# Patient Record
Sex: Female | Born: 1970 | Race: White | Hispanic: No | Smoking: Current every day smoker
Health system: Southern US, Community
[De-identification: ages and names within clinical notes are randomized; demographics above are authoritative.]

## PROBLEM LIST (undated history)

## (undated) DIAGNOSIS — G8929 Other chronic pain: Secondary | ICD-10-CM

## (undated) DIAGNOSIS — K859 Acute pancreatitis without necrosis or infection, unspecified: Secondary | ICD-10-CM

## (undated) DIAGNOSIS — F419 Anxiety disorder, unspecified: Secondary | ICD-10-CM

## (undated) DIAGNOSIS — M797 Fibromyalgia: Secondary | ICD-10-CM

## (undated) DIAGNOSIS — F32A Depression, unspecified: Secondary | ICD-10-CM

## (undated) DIAGNOSIS — F329 Major depressive disorder, single episode, unspecified: Secondary | ICD-10-CM

## (undated) HISTORY — DX: Anxiety disorder, unspecified: F41.9

## (undated) HISTORY — PX: CERVICAL CONE BIOPSY: SUR198

## (undated) HISTORY — DX: Other chronic pain: G89.29

## (undated) HISTORY — DX: Major depressive disorder, single episode, unspecified: F32.9

## (undated) HISTORY — DX: Depression, unspecified: F32.A

---

## 2001-10-06 ENCOUNTER — Observation Stay (HOSPITAL_COMMUNITY): Admission: RE | Admit: 2001-10-06 | Discharge: 2001-10-07 | Payer: Self-pay | Admitting: Obstetrics and Gynecology

## 2001-12-14 ENCOUNTER — Inpatient Hospital Stay (HOSPITAL_COMMUNITY): Admission: AD | Admit: 2001-12-14 | Discharge: 2001-12-15 | Payer: Self-pay | Admitting: Obstetrics and Gynecology

## 2001-12-22 ENCOUNTER — Ambulatory Visit (HOSPITAL_COMMUNITY): Admission: AD | Admit: 2001-12-22 | Discharge: 2001-12-23 | Payer: Self-pay | Admitting: Obstetrics and Gynecology

## 2002-01-15 ENCOUNTER — Inpatient Hospital Stay (HOSPITAL_COMMUNITY): Admission: AD | Admit: 2002-01-15 | Discharge: 2002-01-17 | Payer: Self-pay | Admitting: Obstetrics and Gynecology

## 2003-03-10 ENCOUNTER — Emergency Department (HOSPITAL_COMMUNITY): Admission: EM | Admit: 2003-03-10 | Discharge: 2003-03-10 | Payer: Self-pay | Admitting: Emergency Medicine

## 2005-09-20 ENCOUNTER — Ambulatory Visit (HOSPITAL_COMMUNITY): Admission: RE | Admit: 2005-09-20 | Discharge: 2005-09-20 | Payer: Self-pay | Admitting: Family Medicine

## 2006-03-14 ENCOUNTER — Ambulatory Visit (HOSPITAL_COMMUNITY): Admission: RE | Admit: 2006-03-14 | Discharge: 2006-03-14 | Payer: Self-pay | Admitting: Family Medicine

## 2007-04-06 ENCOUNTER — Encounter: Admission: RE | Admit: 2007-04-06 | Discharge: 2007-04-06 | Payer: Self-pay | Admitting: Obstetrics and Gynecology

## 2007-04-26 ENCOUNTER — Emergency Department (HOSPITAL_COMMUNITY): Admission: EM | Admit: 2007-04-26 | Discharge: 2007-04-26 | Payer: Self-pay | Admitting: Emergency Medicine

## 2008-05-06 ENCOUNTER — Other Ambulatory Visit: Admission: RE | Admit: 2008-05-06 | Discharge: 2008-05-06 | Payer: Self-pay | Admitting: Obstetrics and Gynecology

## 2008-08-15 ENCOUNTER — Ambulatory Visit (HOSPITAL_COMMUNITY): Admission: RE | Admit: 2008-08-15 | Discharge: 2008-08-15 | Payer: Self-pay | Admitting: Family Medicine

## 2009-05-13 ENCOUNTER — Emergency Department (HOSPITAL_COMMUNITY): Admission: EM | Admit: 2009-05-13 | Discharge: 2009-05-14 | Payer: Self-pay | Admitting: Emergency Medicine

## 2009-06-10 ENCOUNTER — Other Ambulatory Visit: Admission: RE | Admit: 2009-06-10 | Discharge: 2009-06-10 | Payer: Self-pay | Admitting: Obstetrics and Gynecology

## 2010-01-21 ENCOUNTER — Ambulatory Visit (HOSPITAL_COMMUNITY): Admission: RE | Admit: 2010-01-21 | Discharge: 2010-01-21 | Payer: Self-pay | Admitting: Family Medicine

## 2011-03-04 NOTE — H&P (Signed)
[facility]  Patient:    Tammy Howell, Tammy Howell Visit Number: [id number] MRN: [id number]          Service Type: OBS Location: 4A A415 01 Attending Physician:  Tilda Burrow Dictated by:   Zerita Boers, C.N.M. 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  PAST MEDICAL HISTORY:  Positive for abnormal Pap smears.  PAST SURGICAL HISTORY:  1. Positive for cold knife cone in 1998.  2. Cryosurgery.  ALLERGIES:  She has seasonal allergies.  She is not allergic to any medications.  MEDICATIONS:  Prenatal vitamins.  SOCIAL HISTORY:  She is single.  Her significant other is supportive of the pregnancy.  PRENATAL COURSE:  Complicated by some premature contractions, without any cervical dilatation.  She has cervical scarring from a cold knife cone done in 1998.  Blood type is O-positive.  Rubella immune.  Hepatitis B surface antigen negative.  HIV negative.  Serology nonreactive.  Pap smear normal, done on November 20, 2001.  GC and Chlamydia are both negative.  MSAFP within normal limits.  Hemoglobin at 28 weeks 11.3, hematocrit at 28 weeks 34.7.  One hour glucose 123.  GBS is pending.  PHYSICAL EXAMINATION:  VITAL SIGNS:  Weight 176 pounds.  Blood pressure 110/70.  ABDOMEN:  There is good fetal movement.  Fundal height 37 cm.  Vertex presentation noted.  PELVIC:  Tight, fingertip, completely effaced, -1 to 0 station.  Bulging lower uterine segment.  Cervical stenosis noted, and that was discussed with Dr. Emelda Fear for our plan of care.  PLAN:  We are going to admit on January 15, 2002 for Foley bulb induction of labor due to maternal fatigue. Dictated by:   Zerita Boers,  C.N.M. Attending Physician:  Tilda Burrow DD:  01/14/02 TD:  01/14/02 Job: [id number]

## 2011-03-04 NOTE — Op Note (Signed)
[facility]  Patient:    Tammy Howell, DONER Visit Number: [id number] MRN: [id number]          Service Type: OBS Location: 4A A415 01 Attending Physician:  Tilda Burrow Dictated by:   Zerita Boers, CNM Admit Date:  12/22/2001 Discharge Date: 12/23/2001   CC:         [facility]   Operative Report                           DELIVERY NOTE  ONSET OF LABOR:  January 16, 2002 at 8 a.m.  DATE OF DELIVERY:  January 16, 2002 at 8:59 a.m.  LENGTH OF FIRST STAGE OF LABOR:  45 minutes.  LENGTH OF SECOND STAGE OF LABOR:  14 minutes.  LENGTH OF THIRD STATE OF LABOR:  6 minutes.  DELIVERY NOTE:  Approximately 8:15 Dr. Emelda Fear was in to examine the patient. She was 1 cm.  She had a stenotic cervix due to a cold knife conization. After exam, cervix was broken up by Dr. Emelda Fear.  She was 3 cm.  She rapidly progressed to complete at 8:45 and had spontaneous delivery of a viable female infant over an intact perineum with Apgars of 9 and 9.  Upon delivery of head, nuchal cord was noted loosely with the fist up through the cord.  That was easily reduced and the infant slipped through without any difficulty.  Upon delivery, the infant had vigorous tone, lusty cry.  Infant was dried.  The cord was clamped and cut and the infant placed on mothers abdomen and attended to by nursing staff.  Placenta was delivered via [facility] mechanism. Three-vessel cord was noted.  Membranes were intact.  Third stage of labor was managed with 1000 cc of lactated Ringers with 20 units of Pitocin in a rapid rate.  Estimated blood loss was approximately 200 cc.  Infant and mother are both stabilized and transferred out to the postpartum unit in stable condition. Dictated by:   Zerita Boers, CNM Attending Physician:  Tilda Burrow DD:  01/16/02 TD:  01/17/02 Job: [id number]

## 2011-03-04 NOTE — H&P (Signed)
[facility]  Patient:    Tammy Howell, Tammy Howell Visit Number: [id number] MRN: [id number]          Service Type: OBS Location: 4A A427 01 Attending Physician:  Tilda Burrow Dictated by:   Christin Bach, M.D. Admit Date:  12/14/2001                           History and Physical  ADMITTING DIAGNOSIS:  Pregnancy at 34 weeks and 6 days, preterm labor.  HISTORY OF PRESENT ILLNESS:  This is a 40 year old female, gravida 3, para 1-0-1-1.  Last menstrual period April 14, 2001, placing menstrual [facility] January 19, 2002, with an ultrasound [facility] of January 26, 2002 and January 27, 2002, who is admitted at 34 weeks and 6 days by menstrual criteria and 33 weeks and 6 days by ultrasound criteria after a pregnancy followed through our office.  She presents today after having mild symptoms of "feeling different all week" and then presenting with contractions since the early morning hours which she was initially able to sleep through, but when she comes to labor and delivery, the cervix has indeed changed somewhat, and today, is closed, internal os very thin with a dilated lower uterine segment, and 90-100% effacement.  The station is -2.  The cervix is mid position.  Membranes are intact. Contraction pattern shows a mild contraction pattern every two to three minutes.  Tolerated very well by the patient.  She has a history of preterm labor x4 with her last pregnancy, which ultimately delivered at 38 weeks as according to her prenatal record, but 36 weeks according to discussions today. The infant weighed 6 pounds and 6 ounces and did well.  The patient is very familiar with tocolytic agents.  The patient has been here since 10 this a.m. and at 2:00 has not changed her cervix, but continues to have regular uterine contractions despite hydration. After discussion of treatment options, our plans are to perform tocolysis with ________ with magnesium sulfate.  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  OBSTETRIC HISTORY:  Preterm labor x4 with the last pregnancy.  ALLERGIES:  SEASONAL allergies only.  SOCIAL HISTORY:  Engaged and works at [employer].  PHYSICAL EXAMINATION:  VITAL SIGNS:  Height 5 feet and 4 inches, weight 175, blood pressure 138/78.  GENERAL:  Exam shows a healthy, alert, and active Caucasian female, tolerating contractions very well.  HEENT:  Pupils equal, round, and reactive.  ABDOMEN:  Thirty-four centimeters.  Estimated fetal weight 5 pounds.  CERVIX:  Closed, 90-100% effaced, vertex presentation with well-developed lower uterine segment.  PLAN:  Magnesium sulfate.  Anticipate overnight hospital stay.  May require allowing her to labor. Dictated by:   Christin Bach, M.D. Attending Physician:  Tilda Burrow DD:  12/14/01 TD:  12/14/01 Job: [id number]

## 2011-03-04 NOTE — Discharge Summary (Signed)
[facility]  Patient:    Tammy Howell, Tammy Howell Visit Number: [id number] MRN: [id number]          Service Type: OBS Location: 4A A427 01 Attending Physician:  Tilda Burrow Dictated by:   Christin Bach, M.D. Admit Date:  12/14/2001 Discharge Date: 12/15/2001                             Discharge Summary  DIAGNOSES: 1. Pregnancy, 34 weeks 6 days. 2. Preterm labor.  DISCHARGE DIAGNOSES: 1. Pregnancy, 35 weeks. 2. Preterm labor, resolved.  PROCEDURE:  Magnesium sulfate, tocolysis x12 hours.  DISCHARGE MEDICATIONS: 1. Ambien 10 mg p.o. q.h.s. 2. Brethine 5 mg 2.5-5 mg p.o. q.6h. x1 week.  DISPOSITION:  Followup in our office in 3-5 days for recheck.  HOSPITAL SUMMARY:  This 40 year old female, gravida 3, para 1-0-1-1, who had a prior delivery at 36 weeks after recurrent episodes of preterm labor x4 is admitted after presenting with regular uterine contractions since early morning hours of December 14, 2001. She was observed, hydrated, and the contractions did not stop, so she was placed on magnesium sulfate tocolysis at 2 g per hour after 4 g loading dose. Evaluation did not identify a UTI or other etiology to the irritability, and it was considered idiopathic. She responded to the magnesium sulfate therapy. At 2 a.m. the IV infiltrated and she was able to be switched to oral terbutaline tablets. She was stable for discharge at midmorning December 15, 2001, for followup in one week. She is to continue the Brethine until 36 weeks, seven days from now. Dictated by:   Christin Bach, M.D. Attending Physician:  Tilda Burrow DD:  12/15/01 TD:  12/15/01 Job: [id number]

## 2011-08-02 LAB — CBC
HCT: 38.8
MCV: 89
RBC: 4.36
WBC: 8.6

## 2011-08-02 LAB — URINALYSIS, ROUTINE W REFLEX MICROSCOPIC
Glucose, UA: NEGATIVE
Ketones, ur: NEGATIVE
Leukocytes, UA: NEGATIVE
Protein, ur: NEGATIVE

## 2011-08-02 LAB — DIFFERENTIAL
Eosinophils Absolute: 0.2
Eosinophils Relative: 2
Lymphs Abs: 2.4
Monocytes Relative: 5

## 2011-08-02 LAB — BASIC METABOLIC PANEL
BUN: 12
Chloride: 108
Potassium: 3.9

## 2011-08-02 LAB — URINE MICROSCOPIC-ADD ON

## 2013-02-19 ENCOUNTER — Other Ambulatory Visit (HOSPITAL_COMMUNITY): Payer: Self-pay | Admitting: Family Medicine

## 2013-02-19 DIAGNOSIS — Z139 Encounter for screening, unspecified: Secondary | ICD-10-CM

## 2013-02-25 ENCOUNTER — Ambulatory Visit (HOSPITAL_COMMUNITY): Payer: Self-pay

## 2013-06-19 ENCOUNTER — Other Ambulatory Visit (HOSPITAL_COMMUNITY): Payer: Self-pay | Admitting: Family Medicine

## 2013-06-19 ENCOUNTER — Ambulatory Visit (HOSPITAL_COMMUNITY)
Admission: RE | Admit: 2013-06-19 | Discharge: 2013-06-19 | Disposition: A | Discharge status: Home / Self Care | Payer: BC Managed Care – PPO | Source: Ambulatory Visit | Attending: Family Medicine | Admitting: Family Medicine

## 2013-06-19 DIAGNOSIS — M542 Cervicalgia: Secondary | ICD-10-CM | POA: Insufficient documentation

## 2013-06-19 DIAGNOSIS — M5030 Other cervical disc degeneration, unspecified cervical region: Secondary | ICD-10-CM | POA: Insufficient documentation

## 2013-06-19 DIAGNOSIS — W19XXXA Unspecified fall, initial encounter: Secondary | ICD-10-CM

## 2014-03-04 ENCOUNTER — Other Ambulatory Visit (HOSPITAL_COMMUNITY): Payer: Self-pay | Admitting: Family Medicine

## 2014-03-04 DIAGNOSIS — Z139 Encounter for screening, unspecified: Secondary | ICD-10-CM

## 2014-03-17 ENCOUNTER — Ambulatory Visit (HOSPITAL_COMMUNITY)
Admission: RE | Admit: 2014-03-17 | Discharge: 2014-03-17 | Disposition: A | Discharge status: Home / Self Care | Payer: BC Managed Care – PPO | Source: Ambulatory Visit | Attending: Family Medicine | Admitting: Family Medicine

## 2014-03-17 DIAGNOSIS — Z139 Encounter for screening, unspecified: Secondary | ICD-10-CM

## 2014-03-17 DIAGNOSIS — Z1231 Encounter for screening mammogram for malignant neoplasm of breast: Secondary | ICD-10-CM | POA: Insufficient documentation

## 2014-07-14 ENCOUNTER — Encounter (HOSPITAL_COMMUNITY): Payer: Self-pay | Admitting: Emergency Medicine

## 2014-07-14 ENCOUNTER — Emergency Department (HOSPITAL_COMMUNITY)
Admission: EM | Admit: 2014-07-14 | Discharge: 2014-07-14 | Disposition: A | Discharge status: Home / Self Care | Payer: BC Managed Care – PPO | Attending: Emergency Medicine | Admitting: Emergency Medicine

## 2014-07-14 DIAGNOSIS — Z3202 Encounter for pregnancy test, result negative: Secondary | ICD-10-CM | POA: Insufficient documentation

## 2014-07-14 DIAGNOSIS — IMO0001 Reserved for inherently not codable concepts without codable children: Secondary | ICD-10-CM | POA: Insufficient documentation

## 2014-07-14 DIAGNOSIS — B9689 Other specified bacterial agents as the cause of diseases classified elsewhere: Secondary | ICD-10-CM | POA: Insufficient documentation

## 2014-07-14 DIAGNOSIS — N760 Acute vaginitis: Secondary | ICD-10-CM | POA: Insufficient documentation

## 2014-07-14 DIAGNOSIS — F17200 Nicotine dependence, unspecified, uncomplicated: Secondary | ICD-10-CM | POA: Diagnosis not present

## 2014-07-14 DIAGNOSIS — F10229 Alcohol dependence with intoxication, unspecified: Secondary | ICD-10-CM | POA: Diagnosis not present

## 2014-07-14 DIAGNOSIS — Z79899 Other long term (current) drug therapy: Secondary | ICD-10-CM | POA: Diagnosis not present

## 2014-07-14 DIAGNOSIS — A499 Bacterial infection, unspecified: Secondary | ICD-10-CM | POA: Insufficient documentation

## 2014-07-14 DIAGNOSIS — K852 Alcohol induced acute pancreatitis without necrosis or infection: Secondary | ICD-10-CM

## 2014-07-14 DIAGNOSIS — R109 Unspecified abdominal pain: Secondary | ICD-10-CM | POA: Diagnosis present

## 2014-07-14 DIAGNOSIS — K859 Acute pancreatitis without necrosis or infection, unspecified: Secondary | ICD-10-CM | POA: Diagnosis not present

## 2014-07-14 HISTORY — DX: Fibromyalgia: M79.7

## 2014-07-14 LAB — CBC WITH DIFFERENTIAL/PLATELET
BASOS ABS: 0 10*3/uL (ref 0.0–0.1)
BASOS PCT: 0 % (ref 0–1)
Eosinophils Absolute: 0.2 10*3/uL (ref 0.0–0.7)
Eosinophils Relative: 2 % (ref 0–5)
HEMATOCRIT: 39.8 % (ref 36.0–46.0)
Hemoglobin: 13.6 g/dL (ref 12.0–15.0)
LYMPHS PCT: 26 % (ref 12–46)
Lymphs Abs: 2.4 10*3/uL (ref 0.7–4.0)
MCH: 31.4 pg (ref 26.0–34.0)
MCHC: 34.2 g/dL (ref 30.0–36.0)
MCV: 91.9 fL (ref 78.0–100.0)
MONO ABS: 0.7 10*3/uL (ref 0.1–1.0)
Monocytes Relative: 7 % (ref 3–12)
NEUTROS ABS: 6 10*3/uL (ref 1.7–7.7)
Neutrophils Relative %: 65 % (ref 43–77)
PLATELETS: 279 10*3/uL (ref 150–400)
RBC: 4.33 MIL/uL (ref 3.87–5.11)
RDW: 12.9 % (ref 11.5–15.5)
WBC: 9.3 10*3/uL (ref 4.0–10.5)

## 2014-07-14 LAB — COMPREHENSIVE METABOLIC PANEL
ALBUMIN: 3.8 g/dL (ref 3.5–5.2)
ALT: 15 U/L (ref 0–35)
AST: 15 U/L (ref 0–37)
Alkaline Phosphatase: 71 U/L (ref 39–117)
Anion gap: 11 (ref 5–15)
BILIRUBIN TOTAL: 0.4 mg/dL (ref 0.3–1.2)
BUN: 11 mg/dL (ref 6–23)
CALCIUM: 8.7 mg/dL (ref 8.4–10.5)
CHLORIDE: 104 meq/L (ref 96–112)
CO2: 26 mEq/L (ref 19–32)
CREATININE: 0.89 mg/dL (ref 0.50–1.10)
GFR calc Af Amer: >90 mL/min (ref >90)
GFR calc non Af Amer: 78 mL/min — ABNORMAL LOW (ref >90)
Glucose, Bld: 93 mg/dL (ref 70–99)
Potassium: 3.6 mEq/L — ABNORMAL LOW (ref 3.7–5.3)
SODIUM: 141 meq/L (ref 137–147)
Total Protein: 7.4 g/dL (ref 6.0–8.3)

## 2014-07-14 LAB — RPR

## 2014-07-14 LAB — URINALYSIS, ROUTINE W REFLEX MICROSCOPIC
Bilirubin Urine: NEGATIVE
GLUCOSE, UA: NEGATIVE mg/dL
KETONES UR: NEGATIVE mg/dL
LEUKOCYTES UA: NEGATIVE
NITRITE: NEGATIVE
PROTEIN: NEGATIVE mg/dL
Specific Gravity, Urine: 1.02 (ref 1.005–1.030)
UROBILINOGEN UA: 0.2 mg/dL (ref 0.0–1.0)
pH: 6 (ref 5.0–8.0)

## 2014-07-14 LAB — WET PREP, GENITAL
Trich, Wet Prep: NONE SEEN
Yeast Wet Prep HPF POC: NONE SEEN

## 2014-07-14 LAB — HIV ANTIBODY (ROUTINE TESTING W REFLEX): HIV 1&2 Ab, 4th Generation: NONREACTIVE

## 2014-07-14 LAB — URINE MICROSCOPIC-ADD ON

## 2014-07-14 LAB — LIPASE, BLOOD: LIPASE: 259 U/L — AB (ref 11–59)

## 2014-07-14 LAB — POC URINE PREG, ED: PREG TEST UR: NEGATIVE

## 2014-07-14 MED ORDER — METRONIDAZOLE 500 MG PO TABS: 500.0000 mg | ORAL_TABLET | Freq: Two times a day (BID) | ORAL | Status: DC

## 2014-07-14 MED ORDER — MORPHINE SULFATE 4 MG/ML IJ SOLN
4.0000 mg | Freq: Once | INTRAMUSCULAR | Status: AC
  Administered 2014-07-14: 4 mg via INTRAVENOUS
  Filled 2014-07-14: qty 1

## 2014-07-14 MED ORDER — ONDANSETRON HCL 4 MG/2ML IJ SOLN
4.0000 mg | Freq: Once | INTRAMUSCULAR | Status: AC
  Administered 2014-07-14: 4 mg via INTRAVENOUS
  Filled 2014-07-14: qty 2

## 2014-07-14 MED ORDER — PROMETHAZINE HCL 25 MG PO TABS: 25.0000 mg | ORAL_TABLET | Freq: Four times a day (QID) | ORAL | Status: DC | PRN

## 2014-07-14 MED ORDER — HYDROCODONE-ACETAMINOPHEN 5-325 MG PO TABS: 1.0000 | ORAL_TABLET | ORAL | Status: DC | PRN

## 2014-07-14 MED ORDER — PROMETHAZINE HCL 25 MG/ML IJ SOLN
12.5000 mg | Freq: Once | INTRAMUSCULAR | Status: AC
  Administered 2014-07-14: 12.5 mg via INTRAVENOUS
  Filled 2014-07-14: qty 1

## 2014-07-14 NOTE — ED Provider Notes (Signed)
ekg abnormal but no old to compare Pt reports pain started in lower abdomen then moved to upper abdomen She denies CP/SOB I doubt ACS She reports nausea but pain improved   Joya Gaskins, MD 07/14/14 1211

## 2014-07-14 NOTE — ED Provider Notes (Signed)
CSN: [id number]     Arrival date & time 07/14/14  7829 History   First MD Initiated Contact with Patient 07/14/14 [phone]     Chief Complaint  Patient presents with  . Abdominal Pain     (Consider location/radiation/quality/duration/timing/severity/associated sxs/prior Treatment) The history is provided by the patient.   Tammy Howell is a 43 y.o. female presenting with a four-day history of abdominal pain.  She describes she was driving her car when she developed fairly sudden onset midline suprapubic pain which reminded her of menstrual cramping, severe and intense and radiated into her upper mid abdomen, worse with palpation and movement, particularly walking.  Her lower pelvic pain has resolved but her epigastric pain is much more intense.  She denies radiation into her back.  She has been nausea without emesis, denies fevers or chills.  Has no vaginal discharge, no dysuria.  She is constipated stating she has not had a bowel movement in 5 days.  She is sexually active in a long-term monogamous relationship.  She is currently one week late her menses, LMP was 06/10/2014.  Past medical history and surgical history is noncontributory.  She took an oxycodone yesterday which she uses occasionally for her fibromyalgia, which transiently relieved her symptoms.     Past Medical History  Diagnosis Date  . Fibromyalgia    Past Surgical History  Procedure Laterality Date  . Cervical cone biopsy     No family history on file. History  Substance Use Topics  . Smoking status: Current Every Day Smoker  . Smokeless tobacco: Not on file  . Alcohol Use: Yes     Comment: ocassional   OB History   Grav Para Term Preterm Abortions TAB SAB Ect Mult Living                 Review of Systems  Constitutional: Negative for fever.  HENT: Negative for congestion and sore throat.   Eyes: Negative.   Respiratory: Negative for chest tightness and shortness of breath.   Cardiovascular: Negative for chest  pain.  Gastrointestinal: Negative for nausea and abdominal pain.  Genitourinary: Negative.   Musculoskeletal: Negative for arthralgias, joint swelling and neck pain.  Skin: Negative.  Negative for rash and wound.  Neurological: Negative for dizziness, weakness, light-headedness, numbness and headaches.  Psychiatric/Behavioral: Negative.       Allergies  Review of patient's allergies indicates no known allergies.  Home Medications   Prior to Admission medications   Medication Sig Start Date End Date Taking? Authorizing Provider  diazepam (VALIUM) 10 MG tablet Take 1 tablet by mouth daily as needed for anxiety.  06/17/14  Yes Historical Provider, MD  oxyCODONE-acetaminophen (PERCOCET/ROXICET) 5-325 MG per tablet Take 1 tablet by mouth every 6 (six) hours as needed for moderate pain.  06/17/14  Yes Historical Provider, MD  HYDROcodone-acetaminophen (NORCO/VICODIN) 5-325 MG per tablet Take 1 tablet by mouth every 4 (four) hours as needed. 07/14/14   Burgess Amor, PA-C  metroNIDAZOLE (FLAGYL) 500 MG tablet Take 1 tablet (500 mg total) by mouth 2 (two) times daily. 07/14/14   Burgess Amor, PA-C  promethazine (PHENERGAN) 25 MG tablet Take 1 tablet (25 mg total) by mouth every 6 (six) hours as needed for nausea or vomiting. 07/14/14   Burgess Amor, PA-C   BP 115/59  Pulse 76  Temp(Src) 98.2 F (36.8 C) (Oral)  Resp 16  Ht  (1.626 m)  Wt 162 lb (73.483 kg)  BMI 27.79 kg/m2  SpO2 100%  LMP 06/10/2014 Physical Exam  Nursing note and vitals reviewed. Constitutional: She appears well-developed and well-nourished.  HENT:  Head: Normocephalic and atraumatic.  Eyes: Conjunctivae are normal.  Neck: Normal range of motion.  Cardiovascular: Normal rate, regular rhythm, normal heart sounds and intact distal pulses.   Pulmonary/Chest: Effort normal and breath sounds normal. She has no wheezes.  Abdominal: Soft. Bowel sounds are normal. She exhibits no distension. There is tenderness in the epigastric  area and periumbilical area. There is no rebound, no guarding and negative Murphy's sign.  Musculoskeletal: Normal range of motion.  Neurological: She is alert.  Skin: Skin is warm and dry.  Psychiatric: She has a normal mood and affect.    ED Course  Procedures (including critical care time) Labs Review Labs Reviewed  WET PREP, GENITAL - Abnormal; Notable for the following:    Clue Cells Wet Prep HPF POC MANY (*)    WBC, Wet Prep HPF POC FEW (*)    All other components within normal limits  URINALYSIS, ROUTINE W REFLEX MICROSCOPIC - Abnormal; Notable for the following:    Hgb urine dipstick SMALL (*)    All other components within normal limits  COMPREHENSIVE METABOLIC PANEL - Abnormal; Notable for the following:    Potassium 3.6 (*)    GFR calc non Af Amer 78 (*)    All other components within normal limits  LIPASE, BLOOD - Abnormal; Notable for the following:    Lipase 259 (*)    All other components within normal limits  URINE MICROSCOPIC-ADD ON - Abnormal; Notable for the following:    Bacteria, UA MANY (*)    All other components within normal limits  GC/CHLAMYDIA PROBE AMP  URINE CULTURE  CBC WITH DIFFERENTIAL  RPR  HIV ANTIBODY (ROUTINE TESTING)  POC URINE PREG, ED    Imaging Review No results found.   EKG Interpretation   Date/Time:  Monday July 14 2014 11:28:25 EDT Ventricular Rate:  71 PR Interval:  123 QRS Duration: 82 QT Interval:  378 QTC Calculation: 411 R Axis:   83 Text Interpretation:  Sinus arrhythmia Minimal ST depression, inferior  leads artifact noted No previous ECGs available Confirmed by Bebe Shaggy  MD,  DONALD ([id number]) on 07/14/2014 11:56:49 AM      MDM   Final diagnoses:  Alcohol-induced acute pancreatitis  Bacterial vaginosis    Discussed labs with patient revealing pancreatitis.  She denies etoh abuse, has occasional etoh but celebrated her birthday this weekend, describes having 6 shots of tequila over the course of 6-7  hours, never felt intoxicated.  Denies h/o pancreatitis.  She was given IV fluids, phenergan, morphine with increased pain and nausea.  She tolerated PO fluids. Stable for dc home,  Prescribed hydrocodone, phenergan.  Pt also with bacterial vaginosis  - flagyl prescribed, but advised to hold abx and not take until other sx have cleared as this can worsened nausea/vomiting, advised no etoh while on flagyl.  Pt understands.  F/u with pcp this week for a recheck of sx and repeat labs.    The patient appears reasonably screened and/or stabilized for discharge and I doubt any other medical condition or other [facility] requiring further screening, evaluation, or treatment in the ED at this time prior to discharge.     Burgess Amor, PA-C 07/15/14 1736

## 2014-07-14 NOTE — ED Provider Notes (Signed)
Patient seen/examined in the Emergency Department in conjunction with Midlevel Provider Idol Patient reports abd pain after recent ETOH use Exam : awake/alert, mild epigastric tenderness.  Abdominal exam otherwise unremarkable Plan: stable for d/c home   Joya Gaskins, MD 07/14/14 1119

## 2014-07-14 NOTE — ED Notes (Signed)
C/o lower abd pain that began Saturday with cramps that now has radiated up to upper abd with n/v.

## 2014-07-14 NOTE — ED Notes (Signed)
Pt left with all personal belongings.

## 2014-07-14 NOTE — ED Notes (Signed)
Pt drank ginger part of a ginger ale.

## 2014-07-14 NOTE — ED Notes (Signed)
Pt ambulated and denies nausea or dizziness. No apparent distress noted.

## 2014-07-14 NOTE — Discharge Instructions (Signed)
Bacterial Vaginosis Bacterial vaginosis is a vaginal infection that occurs when the normal balance of bacteria in the vagina is disrupted. It results from an overgrowth of certain bacteria. This is the most common vaginal infection in women of childbearing age. Treatment is important to prevent complications, especially in pregnant women, as it can cause a premature delivery. CAUSES  Bacterial vaginosis is caused by an increase in harmful bacteria that are normally present in smaller amounts in the vagina. Several different kinds of bacteria can cause bacterial vaginosis. However, the reason that the condition develops is not fully understood. RISK FACTORS Certain activities or behaviors can put you at an increased risk of developing bacterial vaginosis, including:  Having a new sex partner or multiple sex partners.  Douching.  Using an intrauterine device (IUD) for contraception. Women do not get bacterial vaginosis from toilet seats, bedding, swimming pools, or contact with objects around them. SIGNS AND SYMPTOMS  Some women with bacterial vaginosis have no signs or symptoms. Common symptoms include:  Grey vaginal discharge.  A fishlike odor with discharge, especially after sexual intercourse.  Itching or burning of the vagina and vulva.  Burning or pain with urination. DIAGNOSIS  Your health care provider will take a medical history and examine the vagina for signs of bacterial vaginosis. A sample of vaginal fluid may be taken. Your health care provider will look at this sample under a microscope to check for bacteria and abnormal cells. A vaginal pH test may also be done.  TREATMENT  Bacterial vaginosis may be treated with antibiotic medicines. These may be given in the form of a pill or a vaginal cream. A second round of antibiotics may be prescribed if the condition comes back after treatment.  HOME CARE INSTRUCTIONS   Only take over-the-counter or prescription medicines as  directed by your health care provider.  If antibiotic medicine was prescribed, take it as directed. Make sure you finish it even if you start to feel better.  Do not have sex until treatment is completed.  Tell all sexual partners that you have a vaginal infection. They should see their health care provider and be treated if they have problems, such as a mild rash or itching.  Practice safe sex by using condoms and only having one sex partner. SEEK MEDICAL CARE IF:   Your symptoms are not improving after 3 days of treatment.  You have increased discharge or pain.  You have a fever. MAKE SURE YOU:   Understand these instructions.  Will watch your condition.  Will get help right away if you are not doing well or get worse. FOR MORE INFORMATION  Centers for Disease Control and Prevention, Division of STD Prevention: SolutionApps.co.za American Sexual Health Association (ASHA): www.ashastd.org  Document Released: 10/03/2005 Document Revised: 07/24/2013 Document Reviewed: 05/15/2013 [facility] Patient Information 2015 [place], [place]. This information is not intended to replace advice given to you by your health care provider. Make sure you discuss any questions you have with your health care provider.  Acute Pancreatitis Acute pancreatitis is a disease in which the pancreas becomes suddenly inflamed. The pancreas is a large gland located behind your stomach. The pancreas produces enzymes that help digest food. The pancreas also releases the hormones glucagon and insulin that help regulate blood sugar. Damage to the pancreas occurs when the digestive enzymes from the pancreas are activated and begin attacking the pancreas before being released into the intestine. Most acute attacks last a couple of days and can cause serious complications.  Some people become dehydrated and develop low blood pressure. In severe cases, bleeding into the pancreas can lead to shock and can be life-threatening. The  lungs, heart, and kidneys may fail. CAUSES  Pancreatitis can happen to anyone. In some cases, the cause is unknown. Most cases are caused by:  Alcohol abuse.  Gallstones. Other less common causes are:  Certain medicines.  Exposure to certain chemicals.  Infection.  Damage caused by an accident (trauma).  Abdominal surgery. SYMPTOMS   Pain in the upper abdomen that may radiate to the back.  Tenderness and swelling of the abdomen.  Nausea and vomiting. DIAGNOSIS  Your caregiver will perform a physical exam. Blood and stool tests may be done to confirm the diagnosis. Imaging tests may also be done, such as X-rays, CT scans, or an ultrasound of the abdomen. TREATMENT  Treatment usually requires a stay in the hospital. Treatment may include:  Pain medicine.  Fluid replacement through an intravenous line (IV).  Placing a tube in the stomach to remove stomach contents and control vomiting.  Not eating for 3 or 4 days. This gives your pancreas a rest, because enzymes are not being produced that can cause further damage.  Antibiotic medicines if your condition is caused by an infection.  Surgery of the pancreas or gallbladder. HOME CARE INSTRUCTIONS   Follow the diet advised by your caregiver. This may involve avoiding alcohol and decreasing the amount of fat in your diet.  Eat smaller, more frequent meals. This reduces the amount of digestive juices the pancreas produces.  Drink enough fluids to keep your urine clear or pale yellow.  Only take over-the-counter or prescription medicines as directed by your caregiver.  Avoid drinking alcohol if it caused your condition.  Do not smoke.  Get plenty of rest.  Check your blood sugar at home as directed by your caregiver.  Keep all follow-up appointments as directed by your caregiver. SEEK MEDICAL CARE IF:   You do not recover as quickly as expected.  You develop new or worsening symptoms.  You have persistent pain,  weakness, or nausea.  You recover and then have another episode of pain. SEEK IMMEDIATE MEDICAL CARE IF:   You are unable to eat or keep fluids down.  Your pain becomes severe.  You have a fever or persistent symptoms for more than 2 to 3 days.  You have a fever and your symptoms suddenly get worse.  Your skin or the white part of your eyes turn yellow (jaundice).  You develop vomiting.  You feel dizzy, or you faint.  Your blood sugar is high (over 300 mg/dL). MAKE SURE YOU:   Understand these instructions.  Will watch your condition.  Will get help right away if you are not doing well or get worse. Document Released: 10/03/2005 Document Revised: 04/03/2012 Document Reviewed: 01/12/2012 [facility] Patient Information 2015 [place], [place]. This information is not intended to replace advice given to you by your health care provider. Make sure you discuss any questions you have with your health care provider.

## 2014-07-15 LAB — GC/CHLAMYDIA PROBE AMP
CT Probe RNA: NEGATIVE
GC Probe RNA: NEGATIVE

## 2014-07-16 ENCOUNTER — Encounter (HOSPITAL_COMMUNITY): Payer: Self-pay | Admitting: Emergency Medicine

## 2014-07-16 ENCOUNTER — Emergency Department (HOSPITAL_COMMUNITY)
Admission: EM | Admit: 2014-07-16 | Discharge: 2014-07-16 | Disposition: A | Discharge status: Home / Self Care | Payer: BC Managed Care – PPO | Attending: Emergency Medicine | Admitting: Emergency Medicine

## 2014-07-16 ENCOUNTER — Emergency Department (HOSPITAL_COMMUNITY): Payer: BC Managed Care – PPO

## 2014-07-16 DIAGNOSIS — Z3202 Encounter for pregnancy test, result negative: Secondary | ICD-10-CM | POA: Diagnosis not present

## 2014-07-16 DIAGNOSIS — R51 Headache: Secondary | ICD-10-CM | POA: Diagnosis not present

## 2014-07-16 DIAGNOSIS — R42 Dizziness and giddiness: Secondary | ICD-10-CM | POA: Diagnosis not present

## 2014-07-16 DIAGNOSIS — R5381 Other malaise: Secondary | ICD-10-CM | POA: Insufficient documentation

## 2014-07-16 DIAGNOSIS — K5641 Fecal impaction: Secondary | ICD-10-CM | POA: Diagnosis not present

## 2014-07-16 DIAGNOSIS — R5383 Other fatigue: Secondary | ICD-10-CM

## 2014-07-16 DIAGNOSIS — F17200 Nicotine dependence, unspecified, uncomplicated: Secondary | ICD-10-CM | POA: Diagnosis not present

## 2014-07-16 DIAGNOSIS — E860 Dehydration: Secondary | ICD-10-CM | POA: Insufficient documentation

## 2014-07-16 DIAGNOSIS — Z792 Long term (current) use of antibiotics: Secondary | ICD-10-CM | POA: Diagnosis not present

## 2014-07-16 DIAGNOSIS — R519 Headache, unspecified: Secondary | ICD-10-CM

## 2014-07-16 DIAGNOSIS — K5900 Constipation, unspecified: Secondary | ICD-10-CM | POA: Insufficient documentation

## 2014-07-16 HISTORY — DX: Acute pancreatitis without necrosis or infection, unspecified: K85.90

## 2014-07-16 LAB — URINALYSIS, ROUTINE W REFLEX MICROSCOPIC
BILIRUBIN URINE: NEGATIVE
Glucose, UA: NEGATIVE mg/dL
Leukocytes, UA: NEGATIVE
NITRITE: NEGATIVE
Protein, ur: NEGATIVE mg/dL
Specific Gravity, Urine: 1.019 (ref 1.005–1.030)
Urobilinogen, UA: 1 mg/dL (ref 0.0–1.0)
pH: 6 (ref 5.0–8.0)

## 2014-07-16 LAB — COMPREHENSIVE METABOLIC PANEL
ALT: 16 U/L (ref 0–35)
AST: 15 U/L (ref 0–37)
Albumin: 4 g/dL (ref 3.5–5.2)
Alkaline Phosphatase: 73 U/L (ref 39–117)
Anion gap: 16 — ABNORMAL HIGH (ref 5–15)
BILIRUBIN TOTAL: 0.5 mg/dL (ref 0.3–1.2)
BUN: 13 mg/dL (ref 6–23)
CHLORIDE: 101 meq/L (ref 96–112)
CO2: 21 meq/L (ref 19–32)
CREATININE: 0.86 mg/dL (ref 0.50–1.10)
Calcium: 9.2 mg/dL (ref 8.4–10.5)
GFR calc Af Amer: >90 mL/min (ref >90)
GFR, EST NON AFRICAN AMERICAN: 82 mL/min — AB (ref >90)
Glucose, Bld: 83 mg/dL (ref 70–99)
Potassium: 3.8 mEq/L (ref 3.7–5.3)
Sodium: 138 mEq/L (ref 137–147)
Total Protein: 8.1 g/dL (ref 6.0–8.3)

## 2014-07-16 LAB — CBC WITH DIFFERENTIAL/PLATELET
BASOS ABS: 0 10*3/uL (ref 0.0–0.1)
Basophils Relative: 0 % (ref 0–1)
Eosinophils Absolute: 0.1 10*3/uL (ref 0.0–0.7)
Eosinophils Relative: 1 % (ref 0–5)
HCT: 44.4 % (ref 36.0–46.0)
HEMOGLOBIN: 15.2 g/dL — AB (ref 12.0–15.0)
LYMPHS ABS: 2.2 10*3/uL (ref 0.7–4.0)
Lymphocytes Relative: 23 % (ref 12–46)
MCH: 31.3 pg (ref 26.0–34.0)
MCHC: 34.2 g/dL (ref 30.0–36.0)
MCV: 91.4 fL (ref 78.0–100.0)
Monocytes Absolute: 0.6 10*3/uL (ref 0.1–1.0)
Monocytes Relative: 6 % (ref 3–12)
NEUTROS ABS: 6.5 10*3/uL (ref 1.7–7.7)
Neutrophils Relative %: 70 % (ref 43–77)
Platelets: 284 10*3/uL (ref 150–400)
RBC: 4.86 MIL/uL (ref 3.87–5.11)
RDW: 12.6 % (ref 11.5–15.5)
WBC: 9.4 10*3/uL (ref 4.0–10.5)

## 2014-07-16 LAB — URINE CULTURE: Colony Count: >=100000

## 2014-07-16 LAB — URINE MICROSCOPIC-ADD ON

## 2014-07-16 LAB — POC OCCULT BLOOD, ED: Fecal Occult Bld: NEGATIVE

## 2014-07-16 LAB — LIPASE, BLOOD: Lipase: 18 U/L (ref 11–59)

## 2014-07-16 LAB — POC URINE PREG, ED: Preg Test, Ur: NEGATIVE

## 2014-07-16 MED ORDER — MAGNESIUM CITRATE PO SOLN: 0.5000 | Freq: Once | ORAL | Status: DC

## 2014-07-16 MED ORDER — SODIUM CHLORIDE 0.9 % IV BOLUS (SEPSIS): 1000.0000 mL | Freq: Once | INTRAVENOUS | Status: DC

## 2014-07-16 MED ORDER — SODIUM CHLORIDE 0.9 % IV BOLUS (SEPSIS)
1000.0000 mL | Freq: Once | INTRAVENOUS | Status: AC
  Administered 2014-07-16: 1000 mL via INTRAVENOUS

## 2014-07-16 MED ORDER — RA SALINE ENEMA 19-7 GM/118ML RE ENEM: ENEMA | RECTAL | Status: DC

## 2014-07-16 MED ORDER — POLYETHYLENE GLYCOL 3350 17 G PO PACK: 17.0000 g | PACK | Freq: Every day | ORAL | Status: DC

## 2014-07-16 MED ORDER — METOCLOPRAMIDE HCL 5 MG/ML IJ SOLN
10.0000 mg | Freq: Once | INTRAMUSCULAR | Status: AC
  Administered 2014-07-16: 10 mg via INTRAVENOUS
  Filled 2014-07-16: qty 2

## 2014-07-16 NOTE — ED Notes (Addendum)
States was seen on 9/28 dx w/ pancreatitis was to have lipase check this afternoon now states she is constipated and they did not check nher for that peer pts mom pt went to er on Friday because it was her birthday and she had been drinking a lot of etoh

## 2014-07-16 NOTE — ED Provider Notes (Signed)
Medical screening examination/treatment/procedure(s) were performed by non-physician practitioner and as supervising physician I was immediately available for consultation/collaboration.   EKG Interpretation None        Elwin MochaBlair Sanjna Haskew, MD 07/16/14 [phone]

## 2014-07-16 NOTE — ED Provider Notes (Signed)
Care assumed from [facility], PA-C at shift change. Pt here with weakness/dizziness and nausea, abdominal pain with chronic constipation. Labs so far benign, orthostatics ordered and show some dehydration, fluids and meds done. Awaiting U/A and KUB then plan to d/c with stool softeners and antiemetics.   Physical Exam  BP 122/72  Pulse 76  Temp(Src) 98.9 F (37.2 C) (Oral)  Resp 16  SpO2 97%  LMP 06/10/2014  Physical Exam Gen: afebrile, VSS, NAD HEENT: EOMI, MMM Resp: no resp distress CV: rate WNL Abd: appearance normal, NTND, +BS throughout MsK: moving all extremities with ease Neuro: A&O x4  ED Course  Procedures Results for orders placed during the hospital encounter of 07/16/14  CBC WITH DIFFERENTIAL      Result Value Ref Range   WBC 9.4  4.0 - 10.5 K/uL   RBC 4.86  3.87 - 5.11 MIL/uL   Hemoglobin 15.2 (*) 12.0 - 15.0 g/dL   HCT 16.1  09.6 - 04.5 %   MCV 91.4  78.0 - 100.0 fL   MCH 31.3  26.0 - 34.0 pg   MCHC 34.2  30.0 - 36.0 g/dL   RDW 40.9  81.1 - 91.4 %   Platelets 284  150 - 400 K/uL   Neutrophils Relative % 70  43 - 77 %   Neutro Abs 6.5  1.7 - 7.7 K/uL   Lymphocytes Relative 23  12 - 46 %   Lymphs Abs 2.2  0.7 - 4.0 K/uL   Monocytes Relative 6  3 - 12 %   Monocytes Absolute 0.6  0.1 - 1.0 K/uL   Eosinophils Relative 1  0 - 5 %   Eosinophils Absolute 0.1  0.0 - 0.7 K/uL   Basophils Relative 0  0 - 1 %   Basophils Absolute 0.0  0.0 - 0.1 K/uL  COMPREHENSIVE METABOLIC PANEL      Result Value Ref Range   Sodium 138  137 - 147 mEq/L   Potassium 3.8  3.7 - 5.3 mEq/L   Chloride 101  96 - 112 mEq/L   CO2 21  19 - 32 mEq/L   Glucose, Bld 83  70 - 99 mg/dL   BUN 13  6 - 23 mg/dL   Creatinine, Ser 7.82  0.50 - 1.10 mg/dL   Calcium 9.2  8.4 - 95.6 mg/dL   Total Protein 8.1  6.0 - 8.3 g/dL   Albumin 4.0  3.5 - 5.2 g/dL   AST 15  0 - 37 U/L   ALT 16  0 - 35 U/L   Alkaline Phosphatase 73  39 - 117 U/L   Total Bilirubin 0.5  0.3 - 1.2 mg/dL   GFR calc non Af  Amer 82 (*) >90 mL/min   GFR calc Af Amer >90  >90 mL/min   Anion gap 16 (*) 5 - 15  LIPASE, BLOOD      Result Value Ref Range   Lipase 18  11 - 59 U/L  URINALYSIS, ROUTINE W REFLEX MICROSCOPIC      Result Value Ref Range   Color, Urine AMBER (*) YELLOW   APPearance CLEAR  CLEAR   Specific Gravity, Urine 1.019  1.005 - 1.030   pH 6.0  5.0 - 8.0   Glucose, UA NEGATIVE  NEGATIVE mg/dL   Hgb urine dipstick MODERATE (*) NEGATIVE   Bilirubin Urine NEGATIVE  NEGATIVE   Ketones, ur >80 (*) NEGATIVE mg/dL   Protein, ur NEGATIVE  NEGATIVE mg/dL   Urobilinogen, UA  1.0  0.0 - 1.0 mg/dL   Nitrite NEGATIVE  NEGATIVE   Leukocytes, UA NEGATIVE  NEGATIVE  URINE MICROSCOPIC-ADD ON      Result Value Ref Range   Squamous Epithelial / LPF FEW (*) RARE   WBC, UA 0-2  <3 WBC/hpf   RBC / HPF 3-6  <3 RBC/hpf   Bacteria, UA MANY (*) RARE   Urine-Other MUCOUS PRESENT    POC URINE PREG, ED      Result Value Ref Range   Preg Test, Ur NEGATIVE  NEGATIVE  POC OCCULT BLOOD, ED      Result Value Ref Range   Fecal Occult Bld NEGATIVE  NEGATIVE   Dg Abd 2 Views  07/16/2014   CLINICAL DATA:  Constipation.  Pain .  EXAM: ABDOMEN - 2 VIEW  COMPARISON:  None.  FINDINGS: Soft tissue structures are unremarkable. Gas pattern nonspecific. Several air-filled loops of small bowel noted. These are not prominently distended. No large stool burden. No acute bony abnormality.  IMPRESSION: Several air-filled loops of small bowel noted. These are not significantly distended. Colonic gas pattern is nonspecific. No free air. No evidence of prominent constipation.   Electronically Signed   By: Maisie Fus  Register   On: 07/16/2014 17:19     MDM   ICD-9-CM ICD-10-CM  1. Dehydration 276.51 E86.0  2. Acute nonintractable headache, unspecified headache type 784.0 R51  3. Constipation, unspecified constipation type 564.00 K59.00  4. Fecal impaction 560.32 K56.41   5:59 PM Pt states she feels "so much better" after 1L fluids  (second and third L not given), feels like having a BM and passing gas, and wants to eat (states this is the first time in days she's felt like eating). U/A is a dirty catch with +squamous cells, pt states she's menstruating which would explain the Hgb+. U/A showing dehydration with ketones, but doubt UTI with lack of symptoms and nitrite neg, leuk neg. Upreg neg. FOBT neg. Lipase has returned to normal, therefore discussed w/ pt that she can advance diet to soft bland foods, and avoid EtOH. Remaining labs relatively unremarkable aside from dehydration. Pt states she'll use home phenergan for nausea, but she denies ongoing symptoms at this time. Xray unremarkable for obstruction, does show gas and fecal impaction, but otherwise unremarkable.  Will PO challenge and d/c home with enema, miralax, and mag citrate. Discussed using enema tonight, and if unsuccessful by tomorrow then use mag citrate. Discussed miralax use daily or every other day as needed to have regular soft BMs. Will have her f/up with PCP in 5-7 days for ongoing eval. Stressed importance of small sips of fluids. I explained the diagnosis and have given explicit precautions to return to the ER including for any other new or worsening symptoms. The patient understands and accepts the medical plan as it's been dictated and I have answered their questions. Discharge instructions concerning home care and prescriptions have been given. The patient is STABLE and is discharged to home in good condition.  BP 122/72  Pulse 76  Temp(Src) 98.9 F (37.2 C) (Oral)  Resp 16  SpO2 97%  LMP 07/15/2014  Meds ordered this encounter  Medications  . metoCLOPramide (REGLAN) injection 10 mg    Sig:   . sodium chloride 0.9 % bolus 1,000 mL    Sig:   . sodium chloride 0.9 % bolus 1,000 mL    Sig:   . sodium chloride 0.9 % bolus 1,000 mL    Sig:   .  Sodium Phosphates (RA SALINE ENEMA) 19-7 GM/118ML ENEM    Sig: Perform one enema rectally as directed on  instructions label.    Dispense:  1 Bottle    Refill:  2    Order Specific Question:  Supervising Provider    Answer:  Eber HongMILLER, BRIAN D [[id number]]  . polyethylene glycol (MIRALAX / GLYCOLAX) packet    Sig: Take 17 g by mouth daily. You may switch to every other day or once weekly, as needed to have soft bowel movements.    Dispense:  14 each    Refill:  0    Order Specific Question:  Supervising Provider    Answer:  Eber HongMILLER, BRIAN D [[id number]]  . magnesium citrate SOLN    Sig: Take 148 mLs (0.5 Bottles total) by mouth once. Repeat once as needed for bowel movement.    Dispense:  1 Bottle    Refill:  0    Order Specific Question:  Supervising Provider    Answer:  Eber HongMILLER, BRIAN D [[id number]]         Donnita FallsMercedes Strupp Camprubi-Soms, PA-C 07/16/14 [address]  [place]le[place] Simko Strupp Atlanta[place]prubi[place]Soms, [place]PA-C 07/16/14 1812

## 2014-07-16 NOTE — Discharge Instructions (Signed)
Your labs showed you were dehydrated and constipated. Use phenergan as prescribed, as needed for nausea. Stay well hydrated with small sips of fluids throughout the day. Use enema, mag citrate, or miralax as directed for bowel movement. Follow a BRAT (banana-rice-applesauce-toast) diet as described below for the next 24-48 hours. The 'BRAT' diet is suggested, then progress to diet as tolerated as symptoms abate. Call if bloody stools, persistent diarrhea, vomiting, fever or abdominal pain. Return to ER for changing or worsening of symptoms.  Food Choices to Help Relieve Diarrhea When you have diarrhea, the foods you eat and your eating habits are very important. Choosing the right foods and drinks can help relieve diarrhea. Also, because diarrhea can last up to 7 days, you need to replace lost fluids and electrolytes (such as sodium, potassium, and chloride) in order to help prevent dehydration.  WHAT GENERAL GUIDELINES DO I NEED TO FOLLOW?  Slowly drink 1 cup (8 oz) of fluid for each episode of diarrhea. If you are getting enough fluid, your urine will be clear or pale yellow.  Eat starchy foods. Some good choices include white rice, white toast, pasta, low-fiber cereal, baked potatoes (without the skin), saltine crackers, and bagels.  Avoid large servings of any cooked vegetables.  Limit fruit to two servings per day. A serving is  cup or 1 small piece.  Choose foods with less than 2 g of fiber per serving.  Limit fats to less than 8 tsp (38 g) per day.  Avoid fried foods.  Eat foods that have probiotics in them. Probiotics can be found in certain dairy products.  Avoid foods and beverages that may increase the speed at which food moves through the stomach and intestines (gastrointestinal tract). Things to avoid include:  High-fiber foods, such as dried fruit, raw fruits and vegetables, nuts, seeds, and whole grain foods.  Spicy foods and high-fat foods.  Foods and beverages sweetened  with high-fructose corn syrup, honey, or sugar alcohols such as xylitol, sorbitol, and mannitol. WHAT FOODS ARE RECOMMENDED? Grains White rice. White, [place], or pita breads (fresh or toasted), including plain rolls, buns, or bagels. White pasta. Saltine, soda, or graham crackers. Pretzels. Low-fiber cereal. Cooked cereals made with water (such as cornmeal, farina, or cream cereals). Plain muffins. Matzo. Melba toast. Zwieback.  Vegetables Potatoes (without the skin). Strained tomato and vegetable juices. Most well-cooked and canned vegetables without seeds. Tender lettuce. Fruits Cooked or canned applesauce, apricots, cherries, fruit cocktail, grapefruit, peaches, pears, or plums. Fresh bananas, apples without skin, cherries, grapes, cantaloupe, grapefruit, peaches, oranges, or plums.  Meat and Other Protein Products Baked or boiled chicken. Eggs. Tofu. Fish. Seafood. Smooth peanut butter. Ground or well-cooked tender beef, ham, veal, lamb, pork, or poultry.  Dairy Plain yogurt, kefir, and unsweetened liquid yogurt. Lactose-free milk, buttermilk, or soy milk. Plain hard cheese. Beverages Sport drinks. Clear broths. Diluted fruit juices (except prune). Regular, caffeine-free sodas such as ginger ale. Water. Decaffeinated teas. Oral rehydration solutions. Sugar-free beverages not sweetened with sugar alcohols. Other Bouillon, broth, or soups made from recommended foods.  The items listed above may not be a complete list of recommended foods or beverages. Contact your dietitian for more options. WHAT FOODS ARE NOT RECOMMENDED? Grains Whole grain, whole wheat, bran, or rye breads, rolls, pastas, crackers, and cereals. Wild or brown rice. Cereals that contain more than 2 g of fiber per serving. Corn tortillas or taco shells. Cooked or dry oatmeal. Granola. Popcorn. Vegetables Raw vegetables. Cabbage, broccoli, Brussels sprouts, artichokes,  baked beans, beet greens, corn, kale, legumes, peas, sweet  potatoes, and yams. Potato skins. Cooked spinach and cabbage. Fruits Dried fruit, including raisins and dates. Raw fruits. Stewed or dried prunes. Fresh apples with skin, apricots, mangoes, pears, raspberries, and strawberries.  Meat and Other Protein Products Chunky peanut butter. Nuts and seeds. Beans and lentils. Tomasa BlaseBacon.  Dairy High-fat cheeses. Milk, chocolate milk, and beverages made with milk, such as milk shakes. Cream. Ice cream. Sweets and Desserts Sweet rolls, doughnuts, and sweet breads. Pancakes and waffles. Fats and Oils Butter. Cream sauces. Margarine. Salad oils. Plain salad dressings. Olives. Avocados.  Beverages Caffeinated beverages (such as coffee, tea, soda, or energy drinks). Alcoholic beverages. Fruit juices with pulp. Prune juice. Soft drinks sweetened with high-fructose corn syrup or sugar alcohols. Other Coconut. Hot sauce. Chili powder. Mayonnaise. Gravy. Cream-based or milk-based soups.  The items listed above may not be a complete list of foods and beverages to avoid. Contact your dietitian for more information. WHAT SHOULD I DO IF I BECOME DEHYDRATED? Diarrhea can sometimes lead to dehydration. Signs of dehydration include dark urine and dry mouth and skin. If you think you are dehydrated, you should rehydrate with an oral rehydration solution. These solutions can be purchased at pharmacies, retail stores, or online.  Drink -1 cup (120-240 mL) of oral rehydration solution each time you have an episode of diarrhea. If drinking this amount makes your diarrhea worse, try drinking smaller amounts more often. For example, drink 1-3 tsp (5-15 mL) every 5-10 minutes.  A general rule for staying hydrated is to drink 1-2 L of fluid per day. Talk to your health care provider about the specific amount you should be drinking each day. Drink enough fluids to keep your urine clear or pale yellow. Document Released: 12/24/2003 Document Revised: 10/08/2013 Document Reviewed:  08/26/2013 [facility] Patient Information 2015 [place]. This information is not intended to replace advice given to you by your health care provider. Make sure you discuss any questions you have with your health care provider.   Constipation Constipation is when a person:  Poops (has a bowel movement) less than 3 times a week.  Has a hard time pooping.  Has poop that is dry, hard, or bigger than normal. HOME CARE   Eat foods with a lot of fiber in them. This includes fruits, vegetables, beans, and whole grains such as brown rice.  Avoid fatty foods and foods with a lot of sugar. This includes french fries, hamburgers, cookies, candy, and soda.  If you are not getting enough fiber from food, take products with added fiber in them (supplements).  Drink enough fluid to keep your pee (urine) clear or pale yellow.  Exercise on a regular basis, or as told by your doctor.  Go to the restroom when you feel like you need to poop. Do not hold it.  Only take medicine as told by your doctor. Do not take medicines that help you poop (laxatives) without talking to your doctor first. GET HELP RIGHT AWAY IF:   You have bright red blood in your poop (stool).  Your constipation lasts more than 4 days or gets worse.  You have belly (abdominal) or butt (rectal) pain.  You have thin poop (as thin as a pencil).  You lose weight, and it cannot be explained. MAKE SURE YOU:   Understand these instructions.  Will watch your condition.  Will get help right away if you are not doing well or get worse. Document Released: 03/21/2008  Document Revised: 10/08/2013 Document Reviewed: 07/15/2013 [facility] Patient Information 2015 [place], [place]. This information is not intended to replace advice given to you by your health care provider. Make sure you discuss any questions you have with your health care provider.  Dehydration, Adult Dehydration means your body does not have as much fluid as it  needs. Your kidneys, brain, and heart will not work properly without the right amount of fluids and salt.  HOME CARE  Ask your doctor how to replace body fluid losses (rehydrate).  Drink enough fluids to keep your pee (urine) clear or pale yellow.  Drink small amounts of fluids often if you feel sick to your stomach (nauseous) or throw up (vomit).  Eat like you normally do.  Avoid:  Foods or drinks high in sugar.  Bubbly (carbonated) drinks.  Juice.  Very hot or cold fluids.  Drinks with caffeine.  Fatty, greasy foods.  Alcohol.  Tobacco.  Eating too much.  Gelatin desserts.  Wash your hands to avoid spreading germs (bacteria, viruses).  Only take medicine as told by your doctor.  Keep all doctor visits as told. GET HELP RIGHT AWAY IF:   You cannot drink something without throwing up.  You get worse even with treatment.  Your vomit has blood in it or looks greenish.  Your poop (stool) has blood in it or looks black and tarry.  You have not peed in 6 to 8 hours.  You pee a small amount of very dark pee.  You have a fever.  You pass out (faint).  You have belly (abdominal) pain that gets worse or stays in one spot (localizes).  You have a rash, stiff neck, or bad headache.  You get easily annoyed, sleepy, or are hard to wake up.  You feel weak, dizzy, or very thirsty. MAKE SURE YOU:   Understand these instructions.  Will watch your condition.  Will get help right away if you are not doing well or get worse. Document Released: 07/30/2009 Document Revised: 12/26/2011 Document Reviewed: 05/23/2011 [facility] Patient Information 2015 [place], [place]. This information is not intended to replace advice given to you by your health care provider. Make sure you discuss any questions you have with your health care provider.  Fecal Impaction A fecal impaction happens when there is a large, firm amount of stool (or feces) that cannot be passed. The impacted  stool is usually in the rectum, which is the lowest part of the large bowel. The impacted stool can block the colon and cause significant problems. CAUSES  The longer stool stays in the rectum, the harder it gets. Anything that slows down your bowel movements can lead to fecal impaction, such as:  Constipation. This can be a long-standing (chronic) problem or can happen suddenly (acute).  Painful conditions of the rectum, such as hemorrhoids or anal fissures. The pain of these conditions can make you try to avoid having bowel movements.  Narcotic pain-relieving medicines, such as methadone, morphine, or codeine.  Not drinking enough fluids.  Inactivity and bed rest over long periods of time.  Diseases of the brain or nervous system that damage the nerves controlling the muscles of the intestines. SIGNS AND SYMPTOMS   Lack of normal bowel movements or changes in bowel patterns.  Sense of fullness in the rectum but unable to pass stool.  Pain or cramps in the abdominal area (often after meals).  Thin, watery discharge from the rectum. DIAGNOSIS  Your health care provider may suspect that you  have a fecal impaction based on your symptoms and a physical exam. This will include an exam of your rectum. Sometimes X-rays or lab testing may be needed to confirm the diagnosis and to be sure there are no other problems.  TREATMENT   Initially an impaction can be removed manually. Using a gloved finger, your health care provider can remove hard stool from your rectum.  Medicine is sometimes needed. A suppository or enema can be given in the rectum to soften the stool, which can stimulate a bowel movement. Medicines can also be given by mouth (orally).  Though rare, surgery may be needed if the colon has torn (perforated) due to blockage. HOME CARE INSTRUCTIONS   Develop regular bowel habits. This could include getting in the habit of having a bowel movement after your morning cup of coffee or  after eating. Be sure to allow yourself enough time on the toilet.  Maintain a high-fiber diet.  Drink enough fluids to keep your urine clear or pale yellow as directed by your health care provider.  Exercise regularly.  If you begin to get constipated, increase the amount of fiber in your diet. Eat plenty of fruits, vegetables, whole wheat breads, bran, oatmeal, and similar products.  Take natural fiber laxatives or other laxatives only as directed by your health care provider. SEEK MEDICAL CARE IF:   You have ongoing rectal pain.  You require enemas or suppositories more than twice a week.  You have rectal bleeding.  You have continued problems, or you develop abdominal pain.  You have thin, pencil-like stools. SEEK IMMEDIATE MEDICAL CARE IF:  You have black or tarry stools. MAKE SURE YOU:   Understand these instructions.  Will watch your condition.  Will get help right away if you are not doing well or get worse. Document Released: 06/25/2004 Document Revised: 07/24/2013 Document Reviewed: 04/09/2013 [facility] Patient Information 2015 [place], [place]. This information is not intended to replace advice given to you by your health care provider. Make sure you discuss any questions you have with your health care provider.

## 2014-07-16 NOTE — ED Provider Notes (Signed)
Medical screening examination/treatment/procedure(s) were conducted as a shared visit with non-physician practitioner(s) and myself.  I personally evaluated the patient during the encounter.   EKG Interpretation   Date/Time:  Monday July 14 2014 11:28:25 EDT Ventricular Rate:  71 PR Interval:  123 QRS Duration: 82 QT Interval:  378 QTC Calculation: 411 R Axis:   83 Text Interpretation:  Sinus arrhythmia Minimal ST depression, inferior  leads artifact noted No previous ECGs available Confirmed by Bebe ShaggyWICKLINE  MD,  Sharla Tankard ([id number]) on 07/14/2014 11:56:49 AM        Joya Gaskinsonald W Deloss Amico, MD 07/16/14 [phone] [id number]

## 2014-07-16 NOTE — ED Provider Notes (Signed)
CSN: [id number]     Arrival date & time 07/16/14  1300 History   First MD Initiated Contact with Patient 07/16/14 1511     Chief Complaint  Patient presents with  . Weakness  . Dizziness  . Abdominal Pain     (Consider location/radiation/quality/duration/timing/severity/associated sxs/prior Treatment) HPI Comments: Patients with recent episode of pancreatitis likely due to alcohol intake, no abdominal surgery, fibromyalgia -- presents with complaint of continued nausea, generalized weakness, and lightheadedness with standing. Patient states she was discharged from ED and told to not eat solids for 3 days, follow up with PCP today for recheck of lipase. She states that she has not have anything to eat for the past 3 days. Patient states that she has continued to have nausea decreased appetite and has not felt well. She has taken her home oxycodone several times. No fevers or diarrhea. Patient states she has had constipation for the past one week. No urinary symptoms. No chest pain, shortness of breath, or cough. The onset of this condition was acute. The course is constant. Aggravating factors: none. Alleviating factors: none.    The history is provided by the patient and medical records.    Past Medical History  Diagnosis Date  . Fibromyalgia   . Pancreatitis    Past Surgical History  Procedure Laterality Date  . Cervical cone biopsy     No family history on file. History  Substance Use Topics  . Smoking status: Current Every Day Smoker  . Smokeless tobacco: Not on file  . Alcohol Use: Yes     Comment: ocassional   OB History   Grav Para Term Preterm Abortions TAB SAB Ect Mult Living                 Review of Systems  Constitutional: Positive for fatigue. Negative for fever.  HENT: Negative for rhinorrhea and sore throat.   Eyes: Negative for redness.  Respiratory: Negative for cough.   Cardiovascular: Negative for chest pain.  Gastrointestinal: Positive for nausea,  abdominal pain (improved) and constipation. Negative for vomiting, diarrhea and blood in stool.  Genitourinary: Negative for dysuria.  Musculoskeletal: Negative for myalgias.  Skin: Negative for rash.  Neurological: Positive for headaches.    Allergies  Review of patient's allergies indicates no known allergies.  Home Medications   Prior to Admission medications   Medication Sig Start Date End Date Taking? Authorizing Provider  diazepam (VALIUM) 10 MG tablet Take 1 tablet by mouth daily as needed for anxiety.  06/17/14   Historical Provider, MD  HYDROcodone-acetaminophen (NORCO/VICODIN) 5-325 MG per tablet Take 1 tablet by mouth every 4 (four) hours as needed. 07/14/14   Burgess Amor, PA-C  metroNIDAZOLE (FLAGYL) 500 MG tablet Take 1 tablet (500 mg total) by mouth 2 (two) times daily. 07/14/14   Burgess Amor, PA-C  oxyCODONE-acetaminophen (PERCOCET/ROXICET) 5-325 MG per tablet Take 1 tablet by mouth every 6 (six) hours as needed for moderate pain.  06/17/14   Historical Provider, MD  promethazine (PHENERGAN) 25 MG tablet Take 1 tablet (25 mg total) by mouth every 6 (six) hours as needed for nausea or vomiting. 07/14/14   Burgess Amor, PA-C   BP 135/80  Pulse 89  Temp(Src) 98.9 F (37.2 C) (Oral)  Resp 16  SpO2 99%  LMP 06/10/2014  Physical Exam  Nursing note and vitals reviewed. Constitutional: She appears well-developed and well-nourished.  HENT:  Head: Normocephalic and atraumatic.  Mouth/Throat: Oropharynx is clear and moist.  Eyes: Conjunctivae are normal.  Right eye exhibits no discharge. Left eye exhibits no discharge.  Neck: Normal range of motion. Neck supple.  Cardiovascular: Normal rate, regular rhythm and normal heart sounds.   No murmur heard. Pulmonary/Chest: Effort normal and breath sounds normal. No respiratory distress. She has no wheezes. She has no rales.  Abdominal: Soft. There is no tenderness. There is no rebound and no guarding.  Genitourinary: Rectal exam shows no  external hemorrhoid, no internal hemorrhoid, no fissure, no mass, no tenderness and anal tone normal. Guaiac negative stool.  Moderate amount of hard stool in rectal vault.   Neurological: She is alert.  Skin: Skin is warm and dry.  Psychiatric: She has a normal mood and affect.    ED Course  Procedures (including critical care time) Labs Review Labs Reviewed  CBC WITH DIFFERENTIAL - Abnormal; Notable for the following:    Hemoglobin 15.2 (*)    All other components within normal limits  COMPREHENSIVE METABOLIC PANEL - Abnormal; Notable for the following:    GFR calc non Af Amer 82 (*)    Anion gap 16 (*)    All other components within normal limits  LIPASE, BLOOD  URINALYSIS, ROUTINE W REFLEX MICROSCOPIC  POC URINE PREG, ED  POC OCCULT BLOOD, ED    Imaging Review No results found.   EKG Interpretation None      3:32 PM Patient seen and examined. Work-up initiated. Medications ordered.   Vital signs reviewed and are as follows: BP 135/80  Pulse 89  Temp(Src) 98.9 F (37.2 C) (Oral)  Resp 16  SpO2 99%  LMP 06/10/2014  3:39 PM DRE performed with nurse chaperone Cicero Duck(Erika). Pending UA and treatment. Will reassess.   4:14 PM Patient is orthostatic by vitals. Will give additional fluids.   Handoff to Camprubi-Soms PA-C. Plan: fluids, UA, KUB. When improved, d/c to home with stool softener/laxatives.   MDM   Final diagnoses:  Dehydration  Acute nonintractable headache, unspecified headache type  Constipation, unspecified constipation type   Pending completion of work-up.     Renne CriglerJoshua Saroya Riccobono, PA-C 07/16/14 1616

## 2014-07-16 NOTE — ED Notes (Signed)
Pt seen earlier this week for pancreatitis. Pt states they told her not to eat and now she is weak from not eating and reports constipation.

## 2014-07-16 NOTE — ED Notes (Signed)
Pt given PO fluids and small snack, pt states she feels better and is starving.

## 2014-07-17 ENCOUNTER — Telehealth (HOSPITAL_BASED_OUTPATIENT_CLINIC_OR_DEPARTMENT_OTHER): Payer: Self-pay | Admitting: Emergency Medicine

## 2014-07-17 NOTE — Telephone Encounter (Signed)
Post ED Visit - Positive Culture Follow-up  Culture report reviewed by antimicrobial stewardship pharmacist: []  Wes Dulaney, Pharm.D., BCPS [x]  Celedonio MiyamotoJeremy Frens, Pharm.D., BCPS []  Georgina PillionElizabeth Martin, Pharm.D., BCPS []  Holland PatentMinh Pham, VermontPharm.D., BCPS, AAHIVP []  Estella HuskMichelle Turner, Pharm.D., BCPS, AAHIVP []  Carly Sabat, Pharm.D. []  Enzo BiNathan Batchelder, [address]Pharm[address]  Positive urine culture >100,000 colonies/ml Enterobacter Treated with Metronidazole, organism sensitive to the same and no further patient follow-up is required at this time.  Berle MullMiller, Gavrielle Streck 07/17/2014, 2:35 PM

## 2014-07-18 NOTE — ED Provider Notes (Signed)
Medical screening examination/treatment/procedure(s) were performed by non-physician practitioner and as supervising physician I was immediately available for consultation/collaboration.   EKG Interpretation None        Elwin MochaBlair Fia Hebert, MD 07/18/14 [phone]

## 2014-07-30 ENCOUNTER — Other Ambulatory Visit (HOSPITAL_COMMUNITY): Payer: Self-pay | Admitting: Internal Medicine

## 2014-07-30 DIAGNOSIS — R1111 Vomiting without nausea: Secondary | ICD-10-CM

## 2014-07-30 DIAGNOSIS — R109 Unspecified abdominal pain: Secondary | ICD-10-CM

## 2014-08-01 ENCOUNTER — Other Ambulatory Visit (HOSPITAL_COMMUNITY): Payer: Self-pay | Admitting: Internal Medicine

## 2014-08-01 ENCOUNTER — Ambulatory Visit (HOSPITAL_COMMUNITY)
Admission: RE | Admit: 2014-08-01 | Discharge: 2014-08-01 | Disposition: A | Discharge status: Home / Self Care | Payer: BC Managed Care – PPO | Source: Ambulatory Visit | Attending: Internal Medicine | Admitting: Internal Medicine

## 2014-08-01 DIAGNOSIS — R1111 Vomiting without nausea: Secondary | ICD-10-CM | POA: Diagnosis present

## 2014-08-01 DIAGNOSIS — R109 Unspecified abdominal pain: Secondary | ICD-10-CM

## 2014-08-07 ENCOUNTER — Encounter (HOSPITAL_COMMUNITY): Payer: Self-pay

## 2014-08-07 ENCOUNTER — Encounter (HOSPITAL_COMMUNITY)
Admission: RE | Admit: 2014-08-07 | Discharge: 2014-08-07 | Disposition: A | Discharge status: Home / Self Care | Payer: BC Managed Care – PPO | Source: Ambulatory Visit | Attending: General Practice | Admitting: General Practice

## 2014-08-07 DIAGNOSIS — R112 Nausea with vomiting, unspecified: Secondary | ICD-10-CM | POA: Insufficient documentation

## 2014-08-07 DIAGNOSIS — R109 Unspecified abdominal pain: Secondary | ICD-10-CM | POA: Insufficient documentation

## 2014-08-07 MED ORDER — SINCALIDE 5 MCG IJ SOLR
INTRAMUSCULAR | Status: AC
  Administered 2014-08-07: 1.41 ug
  Filled 2014-08-07: qty 5

## 2014-08-07 MED ORDER — STERILE WATER FOR INJECTION IJ SOLN
INTRAMUSCULAR | Status: AC
  Administered 2014-08-07: 1.4 mL
  Filled 2014-08-07: qty 10

## 2014-08-07 MED ORDER — SODIUM CHLORIDE 0.9 % IJ SOLN
INTRAMUSCULAR | Status: AC
  Filled 2014-08-07: qty 30

## 2014-08-07 MED ORDER — TECHNETIUM TC 99M MEBROFENIN IV KIT
5.0000 | PACK | Freq: Once | INTRAVENOUS | Status: AC | PRN
  Administered 2014-08-07: 5 via INTRAVENOUS

## 2015-01-28 ENCOUNTER — Emergency Department (HOSPITAL_COMMUNITY)
Admission: EM | Admit: 2015-01-28 | Discharge: 2015-01-28 | Disposition: A | Discharge status: Home / Self Care | Payer: BC Managed Care – PPO | Attending: Emergency Medicine | Admitting: Emergency Medicine

## 2015-01-28 ENCOUNTER — Emergency Department (HOSPITAL_COMMUNITY): Payer: BC Managed Care – PPO

## 2015-01-28 ENCOUNTER — Encounter (HOSPITAL_COMMUNITY): Payer: Self-pay | Admitting: *Deleted

## 2015-01-28 DIAGNOSIS — S0990XA Unspecified injury of head, initial encounter: Secondary | ICD-10-CM | POA: Insufficient documentation

## 2015-01-28 DIAGNOSIS — R519 Headache, unspecified: Secondary | ICD-10-CM

## 2015-01-28 DIAGNOSIS — Z3202 Encounter for pregnancy test, result negative: Secondary | ICD-10-CM | POA: Diagnosis not present

## 2015-01-28 DIAGNOSIS — Y92410 Unspecified street and highway as the place of occurrence of the external cause: Secondary | ICD-10-CM | POA: Diagnosis not present

## 2015-01-28 DIAGNOSIS — R55 Syncope and collapse: Secondary | ICD-10-CM | POA: Diagnosis not present

## 2015-01-28 DIAGNOSIS — Z8719 Personal history of other diseases of the digestive system: Secondary | ICD-10-CM | POA: Insufficient documentation

## 2015-01-28 DIAGNOSIS — Z79899 Other long term (current) drug therapy: Secondary | ICD-10-CM | POA: Insufficient documentation

## 2015-01-28 DIAGNOSIS — S199XXA Unspecified injury of neck, initial encounter: Secondary | ICD-10-CM | POA: Diagnosis not present

## 2015-01-28 DIAGNOSIS — Z8739 Personal history of other diseases of the musculoskeletal system and connective tissue: Secondary | ICD-10-CM | POA: Insufficient documentation

## 2015-01-28 DIAGNOSIS — Y9389 Activity, other specified: Secondary | ICD-10-CM | POA: Diagnosis not present

## 2015-01-28 DIAGNOSIS — R51 Headache: Secondary | ICD-10-CM

## 2015-01-28 DIAGNOSIS — Y998 Other external cause status: Secondary | ICD-10-CM | POA: Insufficient documentation

## 2015-01-28 DIAGNOSIS — Z720 Tobacco use: Secondary | ICD-10-CM | POA: Diagnosis not present

## 2015-01-28 LAB — URINALYSIS, ROUTINE W REFLEX MICROSCOPIC
Bilirubin Urine: NEGATIVE
Glucose, UA: NEGATIVE mg/dL
Ketones, ur: NEGATIVE mg/dL
LEUKOCYTES UA: NEGATIVE
NITRITE: NEGATIVE
PROTEIN: NEGATIVE mg/dL
Specific Gravity, Urine: 1.025 (ref 1.005–1.030)
UROBILINOGEN UA: 0.2 mg/dL (ref 0.0–1.0)
pH: 5.5 (ref 5.0–8.0)

## 2015-01-28 LAB — URINE MICROSCOPIC-ADD ON

## 2015-01-28 LAB — BASIC METABOLIC PANEL
ANION GAP: 9 (ref 5–15)
BUN: 12 mg/dL (ref 6–23)
CALCIUM: 8.5 mg/dL (ref 8.4–10.5)
CO2: 24 mmol/L (ref 19–32)
Chloride: 105 mmol/L (ref 96–112)
Creatinine, Ser: 0.95 mg/dL (ref 0.50–1.10)
GFR calc Af Amer: 84 mL/min — ABNORMAL LOW (ref >90)
GFR, EST NON AFRICAN AMERICAN: 72 mL/min — AB (ref >90)
GLUCOSE: 92 mg/dL (ref 70–99)
POTASSIUM: 3.7 mmol/L (ref 3.5–5.1)
SODIUM: 138 mmol/L (ref 135–145)

## 2015-01-28 LAB — CBC WITH DIFFERENTIAL/PLATELET
BASOS PCT: 0 % (ref 0–1)
Basophils Absolute: 0 10*3/uL (ref 0.0–0.1)
EOS ABS: 0.2 10*3/uL (ref 0.0–0.7)
Eosinophils Relative: 2 % (ref 0–5)
HCT: 43.1 % (ref 36.0–46.0)
Hemoglobin: 14.5 g/dL (ref 12.0–15.0)
Lymphocytes Relative: 27 % (ref 12–46)
Lymphs Abs: 3.1 10*3/uL (ref 0.7–4.0)
MCH: 31.3 pg (ref 26.0–34.0)
MCHC: 33.6 g/dL (ref 30.0–36.0)
MCV: 93.1 fL (ref 78.0–100.0)
MONOS PCT: 5 % (ref 3–12)
Monocytes Absolute: 0.6 10*3/uL (ref 0.1–1.0)
NEUTROS PCT: 66 % (ref 43–77)
Neutro Abs: 7.6 10*3/uL (ref 1.7–7.7)
PLATELETS: 291 10*3/uL (ref 150–400)
RBC: 4.63 MIL/uL (ref 3.87–5.11)
RDW: 12.8 % (ref 11.5–15.5)
WBC: 11.5 10*3/uL — AB (ref 4.0–10.5)

## 2015-01-28 LAB — POC URINE PREG, ED: Preg Test, Ur: NEGATIVE

## 2015-01-28 MED ORDER — OXYCODONE-ACETAMINOPHEN 5-325 MG PO TABS
2.0000 | ORAL_TABLET | Freq: Once | ORAL | Status: AC
  Administered 2015-01-28: 2 via ORAL
  Filled 2015-01-28: qty 2

## 2015-01-28 MED ORDER — DIAZEPAM 5 MG PO TABS
5.0000 mg | ORAL_TABLET | Freq: Once | ORAL | Status: AC
  Administered 2015-01-28: 5 mg via ORAL
  Filled 2015-01-28: qty 1

## 2015-01-28 MED ORDER — MORPHINE SULFATE 4 MG/ML IJ SOLN
4.0000 mg | Freq: Once | INTRAMUSCULAR | Status: AC
  Administered 2015-01-28: 4 mg via INTRAVENOUS
  Filled 2015-01-28: qty 1
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
## 2015-06-15 ENCOUNTER — Emergency Department (HOSPITAL_COMMUNITY): Payer: BC Managed Care – PPO

## 2015-06-15 ENCOUNTER — Emergency Department (HOSPITAL_COMMUNITY)
Admission: EM | Admit: 2015-06-15 | Discharge: 2015-06-15 | Disposition: A | Discharge status: Home / Self Care | Payer: BC Managed Care – PPO | Attending: Emergency Medicine | Admitting: Emergency Medicine

## 2015-06-15 ENCOUNTER — Encounter (HOSPITAL_COMMUNITY): Payer: Self-pay | Admitting: Cardiology

## 2015-06-15 DIAGNOSIS — S0121XA Laceration without foreign body of nose, initial encounter: Secondary | ICD-10-CM | POA: Diagnosis not present

## 2015-06-15 DIAGNOSIS — Z720 Tobacco use: Secondary | ICD-10-CM | POA: Insufficient documentation

## 2015-06-15 DIAGNOSIS — S61452A Open bite of left hand, initial encounter: Secondary | ICD-10-CM | POA: Insufficient documentation

## 2015-06-15 DIAGNOSIS — Z8739 Personal history of other diseases of the musculoskeletal system and connective tissue: Secondary | ICD-10-CM | POA: Insufficient documentation

## 2015-06-15 DIAGNOSIS — S61451A Open bite of right hand, initial encounter: Secondary | ICD-10-CM | POA: Insufficient documentation

## 2015-06-15 DIAGNOSIS — S0993XA Unspecified injury of face, initial encounter: Secondary | ICD-10-CM | POA: Diagnosis present

## 2015-06-15 DIAGNOSIS — S61459A Open bite of unspecified hand, initial encounter: Secondary | ICD-10-CM

## 2015-06-15 DIAGNOSIS — Y9289 Other specified places as the place of occurrence of the external cause: Secondary | ICD-10-CM | POA: Diagnosis not present

## 2015-06-15 DIAGNOSIS — Z8719 Personal history of other diseases of the digestive system: Secondary | ICD-10-CM | POA: Diagnosis not present

## 2015-06-15 DIAGNOSIS — R000 Tachycardia, unspecified: Secondary | ICD-10-CM | POA: Insufficient documentation

## 2015-06-15 DIAGNOSIS — F419 Anxiety disorder, unspecified: Secondary | ICD-10-CM | POA: Insufficient documentation

## 2015-06-15 DIAGNOSIS — Y9389 Activity, other specified: Secondary | ICD-10-CM | POA: Diagnosis not present

## 2015-06-15 DIAGNOSIS — Z79899 Other long term (current) drug therapy: Secondary | ICD-10-CM | POA: Insufficient documentation

## 2015-06-15 DIAGNOSIS — S0083XA Contusion of other part of head, initial encounter: Secondary | ICD-10-CM

## 2015-06-15 DIAGNOSIS — Y998 Other external cause status: Secondary | ICD-10-CM | POA: Insufficient documentation

## 2015-06-15 MED ORDER — IBUPROFEN 400 MG PO TABS
600.0000 mg | ORAL_TABLET | Freq: Once | ORAL | Status: AC
  Administered 2015-06-15: 600 mg via ORAL
  Filled 2015-06-15: qty 2

## 2015-06-15 MED ORDER — AMOXICILLIN-POT CLAVULANATE 875-125 MG PO TABS
1.0000 | ORAL_TABLET | Freq: Once | ORAL | Status: AC
  Administered 2015-06-15: 1 via ORAL
  Filled 2015-06-15: qty 1

## 2015-06-15 MED ORDER — AMOXICILLIN-POT CLAVULANATE 500-125 MG PO TABS: 1.0000 | ORAL_TABLET | Freq: Three times a day (TID) | ORAL | Status: DC
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
## 2015-06-15 NOTE — ED Notes (Signed)
[facility] at bedside.  

## 2015-10-29 ENCOUNTER — Ambulatory Visit: Payer: Self-pay | Admitting: "Endocrinology

## 2016-02-16 ENCOUNTER — Other Ambulatory Visit: Payer: Self-pay

## 2016-02-16 DIAGNOSIS — Z1231 Encounter for screening mammogram for malignant neoplasm of breast: Secondary | ICD-10-CM
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
## 2016-06-28 ENCOUNTER — Encounter: Payer: Self-pay | Admitting: *Deleted

## 2016-06-29 ENCOUNTER — Other Ambulatory Visit: Payer: Self-pay | Admitting: Obstetrics & Gynecology

## 2016-11-22 ENCOUNTER — Encounter: Payer: Self-pay | Admitting: *Deleted

## 2016-11-25 ENCOUNTER — Other Ambulatory Visit: Payer: Self-pay | Admitting: Obstetrics & Gynecology

## 2016-12-01 ENCOUNTER — Other Ambulatory Visit (HOSPITAL_COMMUNITY)
Admission: RE | Admit: 2016-12-01 | Discharge: 2016-12-01 | Disposition: A | Discharge status: Home / Self Care | Payer: BC Managed Care – PPO | Source: Ambulatory Visit | Attending: Obstetrics & Gynecology | Admitting: Obstetrics & Gynecology

## 2016-12-01 ENCOUNTER — Encounter: Payer: Self-pay | Admitting: Women's Health

## 2016-12-01 ENCOUNTER — Ambulatory Visit (INDEPENDENT_AMBULATORY_CARE_PROVIDER_SITE_OTHER): Payer: BC Managed Care – PPO | Admitting: Women's Health

## 2016-12-01 VITALS — BP 114/78 | HR 84 | Ht 64.0 in | Wt 155.0 lb

## 2016-12-01 DIAGNOSIS — Z1151 Encounter for screening for human papillomavirus (HPV): Secondary | ICD-10-CM | POA: Diagnosis present

## 2016-12-01 DIAGNOSIS — Z113 Encounter for screening for infections with a predominantly sexual mode of transmission: Secondary | ICD-10-CM | POA: Insufficient documentation

## 2016-12-01 DIAGNOSIS — Z0189 Encounter for other specified special examinations: Secondary | ICD-10-CM

## 2016-12-01 DIAGNOSIS — Z124 Encounter for screening for malignant neoplasm of cervix: Secondary | ICD-10-CM

## 2016-12-01 DIAGNOSIS — Z01419 Encounter for gynecological examination (general) (routine) without abnormal findings: Secondary | ICD-10-CM | POA: Insufficient documentation

## 2016-12-01 NOTE — Patient Instructions (Signed)
Call to schedule your mammogram as soon as possible

## 2016-12-01 NOTE — Progress Notes (Signed)
Subjective:   Tammy Howell is a 46 y.o. G2P2 Caucasian female here for a pap only. Had physical w/ PCP in Nov or Dec 2017.   Patient's last menstrual period was 11/26/2016.    Current complaints: none PCP: Dr. 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  Past Medical History Past Medical History:  Diagnosis Date  . Anxiety disorder   . Chronic pain   . Depression    major depressive disorder  . Fibromyalgia   . Pancreatitis     Past Surgical History Past Surgical History:  Procedure Laterality Date  . CERVICAL CONE BIOPSY      Gynecologic History G2P2 Patient's last menstrual period was 11/26/2016. Contraception: none, condoms Last Pap: 2016. Results were: normal, wants to go ahead and do today Last mammogram: 2015. Results were: normal Last TCS: never  Obstetric History OB History  No data available    Current Medications Current Outpatient Prescriptions on File Prior to Visit  Medication Sig Dispense Refill  . diazepam (VALIUM) 10 MG tablet Take 0.5-1 tablets by mouth daily as needed for anxiety.     . metoprolol tartrate (LOPRESSOR) 25 MG tablet Take 25 mg by mouth. 1-2 times daily    . MOVANTIK 25 MG TABS tablet Take 25 mg by mouth daily as needed.     Marland Kitchen. oxyCODONE-acetaminophen (PERCOCET/ROXICET) 5-325 MG per tablet Take 1 tablet by mouth 4 (four) times daily as needed for moderate pain.      No current facility-administered medications on file prior to visit.     Review of Systems Patient denies any headaches, blurred vision,  shortness of breath, chest pain, abdominal pain, problems with bowel movements, urination, or intercourse.  Objective:  BP 114/78 (BP Location: Right Arm, Patient Position: Sitting, Cuff Size: Normal)   Pulse 84   Ht 5\' 4"  (1.626 m)   Wt 155 lb (70.3 kg)   LMP 11/26/2016   BMI 26.61 kg/m  Physical Exam  General:  Well developed, well nourished, no acute distress. She is alert and oriented x3. Skin:  Warm and dry Breasts:  No dominant palpable mass, retraction, or nipple discharge Pelvic:  External genitalia is normal in appearance.  The vagina is normal in appearance. The cervix is bulbous, no CMT.  Thin prep pap is done w/ HR HPV cotesting. Uterus is felt to be normal size, shape, and contour.  No adnexal masses or tenderness noted. Psych:  She has a normal mood and affect  Assessment:   Pap smear/breast exam only STD screen  Plan:  GC/CT from pap, HIV, RPR, HepB today F/U 1553yr for physical, or sooner if needed Mammogram- to schedule screening mammo now  Colonoscopy @46yo , or sooner if problems  Marge DuncansBooker, Rosser Collington Randall CNM, [facility] 12/01/2016 3:57 PM

## 2016-12-02 ENCOUNTER — Other Ambulatory Visit (HOSPITAL_COMMUNITY): Payer: Self-pay

## 2016-12-02 DIAGNOSIS — Z1231 Encounter for screening mammogram for malignant neoplasm of breast: Secondary | ICD-10-CM

## 2016-12-02 LAB — HIV ANTIBODY (ROUTINE TESTING W REFLEX): HIV SCREEN 4TH GENERATION: NONREACTIVE

## 2016-12-02 LAB — HEPATITIS B SURFACE ANTIGEN: Hepatitis B Surface Ag: NEGATIVE

## 2016-12-02 LAB — RPR: RPR Ser Ql: NONREACTIVE

## 2016-12-05 ENCOUNTER — Encounter: Payer: Self-pay | Admitting: Women's Health

## 2016-12-05 DIAGNOSIS — G894 Chronic pain syndrome: Secondary | ICD-10-CM | POA: Insufficient documentation

## 2016-12-05 DIAGNOSIS — F418 Other specified anxiety disorders: Secondary | ICD-10-CM | POA: Insufficient documentation

## 2016-12-05 DIAGNOSIS — M797 Fibromyalgia: Secondary | ICD-10-CM | POA: Insufficient documentation

## 2016-12-05 HISTORY — DX: Other specified anxiety disorders: F41.8

## 2016-12-05 HISTORY — DX: Chronic pain syndrome: G89.4

## 2016-12-05 LAB — CYTOLOGY - PAP
Chlamydia: NEGATIVE
Diagnosis: NEGATIVE
HPV: NOT DETECTED
NEISSERIA GONORRHEA: NEGATIVE

## 2016-12-12 ENCOUNTER — Ambulatory Visit (HOSPITAL_COMMUNITY): Payer: BC Managed Care – PPO

## 2017-08-01 ENCOUNTER — Encounter: Payer: Self-pay | Admitting: Women's Health

## 2017-08-01 ENCOUNTER — Ambulatory Visit (INDEPENDENT_AMBULATORY_CARE_PROVIDER_SITE_OTHER): Payer: BC Managed Care – PPO | Admitting: Women's Health

## 2017-08-01 VITALS — BP 120/70 | HR 103 | Ht 64.0 in | Wt 152.8 lb

## 2017-08-01 DIAGNOSIS — R4586 Emotional lability: Secondary | ICD-10-CM

## 2017-08-01 DIAGNOSIS — N926 Irregular menstruation, unspecified: Secondary | ICD-10-CM | POA: Diagnosis not present

## 2017-08-01 DIAGNOSIS — R232 Flushing: Secondary | ICD-10-CM | POA: Diagnosis not present

## 2017-08-01 DIAGNOSIS — N946 Dysmenorrhea, unspecified: Secondary | ICD-10-CM

## 2017-08-01 DIAGNOSIS — N951 Menopausal and female climacteric states: Secondary | ICD-10-CM

## 2017-08-01 MED ORDER — PAROXETINE HCL 10 MG PO TABS: 10.0000 mg | ORAL_TABLET | Freq: Every day | ORAL | 11 refills | Status: DC

## 2017-08-01 NOTE — Patient Instructions (Signed)
Call us when your period starts to have your IUD put in   Levonorgestrel intrauterine device (IUD) What is this medicine? LEVONORGESTREL IUD (LEE voe nor jes trel) is a contraceptive (birth control) device. The device is placed inside the uterus by a healthcare professional. It is used to prevent pregnancy. This device can also be used to treat heavy bleeding that occurs during your period. This medicine may be used for other purposes; ask your health care provider or pharmacist if you have questions. COMMON BRAND NAME(S): Cameron Ali What should I tell my health care provider before I take this medicine? They need to know if you have any of these conditions: -abnormal Pap smear -cancer of the breast, uterus, or cervix -diabetes -endometritis -genital or pelvic infection now or in the past -have more than one sexual partner or your partner has more than one partner -heart disease -history of an ectopic or tubal pregnancy -immune system problems -IUD in place -liver disease or tumor -problems with blood clots or take blood-thinners -seizures -use intravenous drugs -uterus of unusual shape -vaginal bleeding that has not been explained -an unusual or allergic reaction to levonorgestrel, other hormones, silicone, or polyethylene, medicines, foods, dyes, or preservatives -pregnant or trying to get pregnant -breast-feeding How should I use this medicine? This device is placed inside the uterus by a health care professional. Talk to your pediatrician regarding the use of this medicine in children. Special care may be needed. Overdosage: If you think you have taken too much of this medicine contact a poison control center or emergency room at once. NOTE: This medicine is only for you. Do not share this medicine with others. What if I miss a dose? This does not apply. Depending on the brand of device you have inserted, the device will need to be replaced every 3 to 5  years if you wish to continue using this type of birth control. What may interact with this medicine? Do not take this medicine with any of the following medications: -amprenavir -bosentan -fosamprenavir This medicine may also interact with the following medications: -aprepitant -armodafinil -barbiturate medicines for inducing sleep or treating seizures -bexarotene -boceprevir -griseofulvin -medicines to treat seizures like carbamazepine, ethotoin, felbamate, oxcarbazepine, phenytoin, topiramate -modafinil -pioglitazone -rifabutin -rifampin -rifapentine -some medicines to treat HIV infection like atazanavir, efavirenz, indinavir, lopinavir, nelfinavir, tipranavir, ritonavir -St. John's wort -warfarin This list may not describe all possible interactions. Give your health care provider a list of all the medicines, herbs, non-prescription drugs, or dietary supplements you use. Also tell them if you smoke, drink alcohol, or use illegal drugs. Some items may interact with your medicine. What should I watch for while using this medicine? Visit your doctor or health care professional for regular check ups. See your doctor if you or your partner has sexual contact with others, becomes HIV positive, or gets a sexual transmitted disease. This product does not protect you against HIV infection (AIDS) or other sexually transmitted diseases. You can check the placement of the IUD yourself by reaching up to the top of your vagina with clean fingers to feel the threads. Do not pull on the threads. It is a good habit to check placement after each menstrual period. Call your doctor right away if you feel more of the IUD than just the threads or if you cannot feel the threads at all. The IUD may come out by itself. You may become pregnant if the device comes out. If you notice that the IUD  has come out use a backup birth control method like condoms and call your health care provider. Using tampons will not  change the position of the IUD and are okay to use during your period. This IUD can be safely scanned with magnetic resonance imaging (MRI) only under specific conditions. Before you have an MRI, tell your healthcare provider that you have an IUD in place, and which type of IUD you have in place. What side effects may I notice from receiving this medicine? Side effects that you should report to your doctor or health care professional as soon as possible: -allergic reactions like skin rash, itching or hives, swelling of the face, lips, or tongue -fever, flu-like symptoms -genital sores -high blood pressure -no menstrual period for 6 weeks during use -pain, swelling, warmth in the leg -pelvic pain or tenderness -severe or sudden headache -signs of pregnancy -stomach cramping -sudden shortness of breath -trouble with balance, talking, or walking -unusual vaginal bleeding, discharge -yellowing of the eyes or skin Side effects that usually do not require medical attention (report to your doctor or health care professional if they continue or are bothersome): -acne -breast pain -change in sex drive or performance -changes in weight -cramping, dizziness, or faintness while the device is being inserted -headache -irregular menstrual bleeding within first 3 to 6 months of use -nausea This list may not describe all possible side effects. Call your doctor for medical advice about side effects. You may report side effects to FDA at 1-800-FDA-1088. Where should I keep my medicine? This does not apply. NOTE: This sheet is a summary. It may not cover all possible information. If you have questions about this medicine, talk to your doctor, pharmacist, or health care provider.  2018 Elsevier/Gold Standard (2016-07-15 14:14:56)  Paroxetine tablets What is this medicine? PAROXETINE (pa ROX e teen) is used to treat depression. It may also be used to treat anxiety disorders, obsessive compulsive  disorder, panic attacks, post traumatic stress, and premenstrual dysphoric disorder (PMDD). This medicine may be used for other purposes; ask your health care provider or pharmacist if you have questions. COMMON BRAND NAME(S): Paxil, Pexeva What should I tell my health care provider before I take this medicine? They need to know if you have any of these conditions: -bipolar disorder or a family history of bipolar disorder -bleeding disorders -glaucoma -heart disease -kidney disease -liver disease -low levels of sodium in the blood -seizures -suicidal thoughts, plans, or attempt; a previous suicide attempt by you or a family member -take MAOIs like Carbex, Eldepryl, Marplan, Nardil, and Parnate -take medicines that treat or prevent blood clots -thyroid disease -an unusual or allergic reaction to paroxetine, other medicines, foods, dyes, or preservatives -pregnant or trying to get pregnant -breast-feeding How should I use this medicine? Take this medicine by mouth with a glass of water. Follow the directions on the prescription label. You can take it with or without food. Take your medicine at regular intervals. Do not take your medicine more often than directed. Do not stop taking this medicine suddenly except upon the advice of your doctor. Stopping this medicine too quickly may cause serious side effects or your condition may worsen. A special MedGuide will be given to you by the pharmacist with each prescription and refill. Be sure to read this information carefully each time. Talk to your pediatrician regarding the use of this medicine in children. Special care may be needed. Overdosage: If you think you have taken too much of this medicine  contact a poison control center or emergency room at once. NOTE: This medicine is only for you. Do not share this medicine with others. What if I miss a dose? If you miss a dose, take it as soon as you can. If it is almost time for your next dose,  take only that dose. Do not take double or extra doses. What may interact with this medicine? Do not take this medicine with any of the following medications: -linezolid -MAOIs like Carbex, Eldepryl, Marplan, Nardil, and Parnate -methylene blue (injected into a vein) -pimozide -thioridazine This medicine may also interact with the following medications: -alcohol -amphetamines -aspirin and aspirin-like medicines -atomoxetine -certain medicines for depression, anxiety, or psychotic disturbances -certain medicines for irregular heart beat like propafenone, flecainide, encainide, and quinidine -certain medicines for migraine headache like almotriptan, eletriptan, frovatriptan, naratriptan, rizatriptan, sumatriptan, zolmitriptan -cimetidine -digoxin -diuretics -fentanyl -fosamprenavir -furazolidone -isoniazid -lithium -medicines that treat or prevent blood clots like warfarin, enoxaparin, and dalteparin -medicines for sleep -NSAIDs, medicines for pain and inflammation, like ibuprofen or naproxen -phenobarbital -phenytoin -procarbazine -rasagiline -ritonavir -supplements like St. John's wort, kava kava, valerian -tamoxifen -tramadol -tryptophan This list may not describe all possible interactions. Give your health care provider a list of all the medicines, herbs, non-prescription drugs, or dietary supplements you use. Also tell them if you smoke, drink alcohol, or use illegal drugs. Some items may interact with your medicine. What should I watch for while using this medicine? Tell your doctor if your symptoms do not get better or if they get worse. Visit your doctor or health care professional for regular checks on your progress. Because it may take several weeks to see the full effects of this medicine, it is important to continue your treatment as prescribed by your doctor. Patients and their families should watch out for new or worsening thoughts of suicide or depression. Also  watch out for sudden changes in feelings such as feeling anxious, agitated, panicky, irritable, hostile, aggressive, impulsive, severely restless, overly excited and hyperactive, or not being able to sleep. If this happens, especially at the beginning of treatment or after a change in dose, call your health care professional. Tammy Howell may get drowsy or dizzy. Do not drive, use machinery, or do anything that needs mental alertness until you know how this medicine affects you. Do not stand or sit up quickly, especially if you are an older patient. This reduces the risk of dizzy or fainting spells. Alcohol may interfere with the effect of this medicine. Avoid alcoholic drinks. Your mouth may get dry. Chewing sugarless gum or sucking hard candy, and drinking plenty of water will help. Contact your doctor if the problem does not go away or is severe. What side effects may I notice from receiving this medicine? Side effects that you should report to your doctor or health care professional as soon as possible: -allergic reactions like skin rash, itching or hives, swelling of the face, lips, or tongue -anxious -black, tarry stools -changes in vision -confusion -elevated mood, decreased need for sleep, racing thoughts, impulsive behavior -eye pain -fast, irregular heartbeat -feeling faint or lightheaded, falls -feeling agitated, angry, or irritable -hallucination, loss of contact with reality -loss of balance or coordination -loss of memory -painful or prolonged erections -restlessness, pacing, inability to keep still -seizures -stiff muscles -suicidal thoughts or other mood changes -trouble sleeping -unusual bleeding or bruising -unusually weak or tired -vomiting Side effects that usually do not require medical attention (report to your doctor or health care professional  if they continue or are bothersome): -change in appetite or weight -change in sex drive or performance -diarrhea -dizziness -dry  mouth -increased sweating -indigestion, nausea -tired -tremors This list may not describe all possible side effects. Call your doctor for medical advice about side effects. You may report side effects to FDA at 1-800-FDA-1088. Where should I keep my medicine? Keep out of the reach of children. Store at room temperature between 15 and 30 degrees C (59 and 86 degrees F). Keep container tightly closed. Throw away any unused medicine after the expiration date. NOTE: This sheet is a summary. It may not cover all possible information. If you have questions about this medicine, talk to your doctor, pharmacist, or health care provider.  2018 Elsevier/Gold Standard (2016-03-05 15:50:32)

## 2017-08-01 NOTE — Progress Notes (Addendum)
   GYN VISIT Patient name: Tammy Howell MRN [id number]  Date of birth: 1971-02-01 Chief Complaint:   having irregular periods (painful periods; moody; emotional; sweaty)  History of Present Illness:   Tammy Howell is a 46 y.o. [id number] Caucasian female being seen today for report of irregular and painful periods, very moody and emotional, and hot flashes/sweating. Reports periods used to be regular and normal flow. For the past several months periods have become irregular, sometimes skipping a month. Periods are very painful now, otc meds have not helped. Sometimes misses work. Flow is normal.  States she is usually a happy person, however right before her period starts now, she becomes very emotional, crying over things she would normally not, also has gotten bad road rage, states 'I would never act on it', but feels like this definitely isn't her normal. Denies SI/HI.  Also reports waking up in mornings drenched with sweat, even feels hot/sweaty after shower, states hot flashes/sweatiness gets better as day goes on.   Her mom was around this age when she went through menopause.    Patient's last menstrual period was 07/18/2017. The current method of family planning is abstinence. Last pap 12/01/16. Results were:  normal w/ -HRHPV Review of Systems:   Denies any headaches, blurred vision, fatigue, shortness of breath, chest pain, abdominal pain, abnormal vaginal discharge/itching/odor/irritation, problems with bowel movements, urination, or intercourse unless otherwise stated above.  Pertinent History Reviewed:  Reviewed past medical,surgical, social and family history.  Reviewed problem list, medications and allergies. Physical Assessment:   Vitals:   08/01/17 0855  BP: 120/70  Pulse: (!) 103  Weight: 152 lb 12.8 oz (69.3 kg)  Height:  (1.626 m)  Body mass index is 26.23 kg/m.       Physical Examination:   General appearance: alert, well appearing, and in no distress  Mental  status: alert, oriented to person, place, and time  Skin: warm & dry   Cardiovascular: normal heart rate noted  Respiratory: normal respiratory effort  Abdomen: soft, non-tender   Pelvic: examination not indicated  Extremities: no edema  Depression screen PHQ 2/9 08/01/2017  Decreased Interest 0  Down, Depressed, Hopeless 0  PHQ - 2 Score 0    Assessment & Plan:  1) Perimenopausal symptoms irregular periods/dysmenorrhea, mood swings, hot flashes/sweats> discussed options and risks/benefits of expectant management, lifestyle modifications, HRT, antidepressant, contraception for period management.   -Pt wants to try antidepressant, rx paroxetine  daily which can help with mood swings as well as hot flashes/sweats.   -She also wants a Mirena IUD for period management, she has had in past and liked. Discussed it lasts 5 years and very well may get her through perimenopausal period. She has not had sex in over 48yr, so will call when period starts for insertion.   Return for pt will call when period starts for IUD insertion on period.  Marge Duncans CNM, [facility] 08/01/17

## 2017-08-02 ENCOUNTER — Encounter: Payer: Self-pay | Admitting: Women's Health

## 2017-08-02 DIAGNOSIS — R232 Flushing: Secondary | ICD-10-CM | POA: Insufficient documentation

## 2017-08-02 DIAGNOSIS — N946 Dysmenorrhea, unspecified: Secondary | ICD-10-CM | POA: Insufficient documentation

## 2017-08-02 DIAGNOSIS — R4586 Emotional lability: Secondary | ICD-10-CM | POA: Insufficient documentation

## 2017-08-02 DIAGNOSIS — N926 Irregular menstruation, unspecified: Secondary | ICD-10-CM | POA: Insufficient documentation

## 2017-08-02 HISTORY — DX: Emotional lability: R45.86

## 2017-08-18 ENCOUNTER — Ambulatory Visit: Payer: BC Managed Care – PPO | Admitting: Women's Health

## 2017-08-29 ENCOUNTER — Ambulatory Visit: Payer: BC Managed Care – PPO | Admitting: Women's Health

## 2017-09-11 ENCOUNTER — Encounter: Payer: Self-pay | Admitting: Women's Health

## 2017-09-11 ENCOUNTER — Ambulatory Visit (INDEPENDENT_AMBULATORY_CARE_PROVIDER_SITE_OTHER): Payer: BC Managed Care – PPO | Admitting: Women's Health

## 2017-09-11 VITALS — BP 100/60 | HR 60 | Ht 64.0 in | Wt 151.0 lb

## 2017-09-11 DIAGNOSIS — Z30430 Encounter for insertion of intrauterine contraceptive device: Secondary | ICD-10-CM

## 2017-09-11 DIAGNOSIS — Z3202 Encounter for pregnancy test, result negative: Secondary | ICD-10-CM

## 2017-09-11 DIAGNOSIS — Z538 Procedure and treatment not carried out for other reasons: Secondary | ICD-10-CM

## 2017-09-11 LAB — POCT URINE PREGNANCY: PREG TEST UR: NEGATIVE

## 2017-09-11 NOTE — Progress Notes (Addendum)
   IUD INSERTION Patient name: Tammy HartSusan D Glantz MRN [id number]  Date of birth: February 13, 1971 Subjective Findings:   Tammy HartSusan D Couillard is a 46 y.o. [phone] [id number] Caucasian female being seen today for insertion of a Mirena IUD. She desires an IUD for period management, had one about 5 years ago and she liked it. For past few months periods have become irregular, lasting longer, more painful with generally normal flow. She was started on paroxetine 10mg  daily for reports of mood swings/hot flashes, road rage. States it has helped tremendously, she feels normal again!  Patient's last menstrual period was 08/27/2017. Last sexual intercourse was over a year ago Last pap 12/01/16. Results were:  normal w/ -HRHPV and -gc/ct  The risks and benefits of the method and placement have been thouroughly reviewed with the patient and all questions were answered.  Specifically the patient is aware of failure rate of 10/998, expulsion of the IUD and of possible perforation.  The patient is aware of irregular bleeding due to the method and understands the incidence of irregular bleeding diminishes with time.  Signed copy of informed consent in chart.  Pertinent History Reviewed:   Reviewed past medical,surgical, social, obstetrical and family history.  Reviewed problem list, medications and allergies. Objective Findings & Procedure:   Vitals:   09/11/17 1534  BP: 100/60  Pulse: 60  Weight: 151 lb (68.5 kg)  Height: 5\' 4"  (1.626 m)  Body mass index is 25.92 kg/m.  Results for orders placed or performed in visit on 09/11/17 (from the past 24 hour(s))  POCT urine pregnancy   Collection Time: 09/11/17  3:43 PM  Result Value Ref Range   Preg Test, Ur Negative Negative     Time out was performed.  A graves speculum was placed in the vagina.  The cervix was visualized, prepped using Betadine, and grasped with a single tooth tenaculum. I was unable to completely pass dilator through cervix and pt reported pain with attempts.  Dr. Despina HiddenEure attempted, then attempted IUD unsuccessfully. Feels cervix is stenotic.   Assessment & Plan:   1) Failed Mirena IUD insertion-wants to return in couple of weeks to discuss options for period management- will schedule w/ MD  Orders Placed This Encounter  Procedures  . POCT urine pregnancy    Return in about 2 weeks (around 09/25/2017) for F/U w/ [facility].  Marge DuncansBooker, Geronimo Diliberto Randall CNM, [facility] 09/11/2017 4:58 PM

## 2017-11-15 ENCOUNTER — Other Ambulatory Visit (HOSPITAL_COMMUNITY): Payer: Self-pay | Admitting: Internal Medicine

## 2017-11-15 DIAGNOSIS — Z1231 Encounter for screening mammogram for malignant neoplasm of breast: Secondary | ICD-10-CM

## 2017-12-18 ENCOUNTER — Other Ambulatory Visit (HOSPITAL_COMMUNITY): Payer: Self-pay | Admitting: Internal Medicine

## 2017-12-18 DIAGNOSIS — Z1231 Encounter for screening mammogram for malignant neoplasm of breast: Secondary | ICD-10-CM

## 2018-01-24 ENCOUNTER — Other Ambulatory Visit: Payer: Self-pay

## 2018-01-24 ENCOUNTER — Emergency Department (HOSPITAL_COMMUNITY)
Admission: EM | Admit: 2018-01-24 | Discharge: 2018-01-24 | Disposition: A | Discharge status: Home / Self Care | Payer: BC Managed Care – PPO | Attending: Emergency Medicine | Admitting: Emergency Medicine

## 2018-01-24 ENCOUNTER — Encounter (HOSPITAL_COMMUNITY): Payer: Self-pay | Admitting: Emergency Medicine

## 2018-01-24 DIAGNOSIS — R5383 Other fatigue: Secondary | ICD-10-CM | POA: Insufficient documentation

## 2018-01-24 DIAGNOSIS — R531 Weakness: Secondary | ICD-10-CM

## 2018-01-24 DIAGNOSIS — M6281 Muscle weakness (generalized): Secondary | ICD-10-CM | POA: Diagnosis present

## 2018-01-24 DIAGNOSIS — Z87891 Personal history of nicotine dependence: Secondary | ICD-10-CM | POA: Insufficient documentation

## 2018-01-24 LAB — CBC WITH DIFFERENTIAL/PLATELET
Basophils Absolute: 0 10*3/uL (ref 0.0–0.1)
Basophils Relative: 1 %
Eosinophils Absolute: 0.1 10*3/uL (ref 0.0–0.7)
Eosinophils Relative: 1 %
HCT: 39.4 % (ref 36.0–46.0)
Hemoglobin: 13.1 g/dL (ref 12.0–15.0)
Lymphocytes Relative: 31 %
Lymphs Abs: 1.9 10*3/uL (ref 0.7–4.0)
MCH: 30 pg (ref 26.0–34.0)
MCHC: 33.2 g/dL (ref 30.0–36.0)
MCV: 90.4 fL (ref 78.0–100.0)
Monocytes Absolute: 0.3 10*3/uL (ref 0.1–1.0)
Monocytes Relative: 5 %
Neutro Abs: 3.8 10*3/uL (ref 1.7–7.7)
Neutrophils Relative %: 62 %
Platelets: 232 10*3/uL (ref 150–400)
RBC: 4.36 MIL/uL (ref 3.87–5.11)
RDW: 12 % (ref 11.5–15.5)
WBC: 6.1 10*3/uL (ref 4.0–10.5)

## 2018-01-24 LAB — URINALYSIS, ROUTINE W REFLEX MICROSCOPIC
BACTERIA UA: NONE SEEN
Bilirubin Urine: NEGATIVE
Glucose, UA: NEGATIVE mg/dL
Ketones, ur: 5 mg/dL — AB
Leukocytes, UA: NEGATIVE
NITRITE: NEGATIVE
PROTEIN: NEGATIVE mg/dL
SPECIFIC GRAVITY, URINE: 1.023 (ref 1.005–1.030)
pH: 5 (ref 5.0–8.0)

## 2018-01-24 LAB — COMPREHENSIVE METABOLIC PANEL
ALBUMIN: 4.5 g/dL (ref 3.5–5.0)
ALK PHOS: 54 U/L (ref 38–126)
ALT: 16 U/L (ref 14–54)
AST: 21 U/L (ref 15–41)
Anion gap: 11 (ref 5–15)
BUN: 13 mg/dL (ref 6–20)
CALCIUM: 9.4 mg/dL (ref 8.9–10.3)
CHLORIDE: 103 mmol/L (ref 101–111)
CO2: 25 mmol/L (ref 22–32)
CREATININE: 0.91 mg/dL (ref 0.44–1.00)
GFR calc Af Amer: >60 mL/min (ref >60)
GFR calc non Af Amer: >60 mL/min (ref >60)
GLUCOSE: 128 mg/dL — AB (ref 65–99)
Potassium: 3.6 mmol/L (ref 3.5–5.1)
SODIUM: 139 mmol/L (ref 135–145)
Total Bilirubin: 0.7 mg/dL (ref 0.3–1.2)
Total Protein: 7.4 g/dL (ref 6.5–8.1)

## 2018-01-24 LAB — PREGNANCY, URINE: PREG TEST UR: NEGATIVE

## 2018-01-24 NOTE — Discharge Instructions (Signed)
You were seen in the ED today with worsening weakness. Your labs and EKG are normal. You may need additional testing through your PCP and/or endocrinologist. Return to the ED with any new or worsening symptoms.

## 2018-01-24 NOTE — ED Triage Notes (Signed)
PT states worsening in generalized weakness and fatigue since Feb 2019 with dizziness intermittently. PT denies any fevers, n/v/d.

## 2018-01-24 NOTE — ED Provider Notes (Signed)
Emergency Department Provider Note   I have reviewed the triage vital signs and the nursing notes.   HISTORY  Chief Complaint Weakness   HPI Tammy Howell is a 47 y.o. female with PMH of fibromyalgia, pancreatitis, and MDD presents to the emergency department for evaluation of generalized weakness and fatigue for the past 2.5 months.  She has been seen by her primary care physician who ran initial blood work which came back normal.  She states that she was referred to an endocrinologist regarding her thyroid but cannot recall if the labs are abnormal.  She has an appointment scheduled in May but continues to have worsening symptoms who presented today.  Patient states that she is having increased difficulty at work and she feels as if her job is putting additional pressure on her.  She has some intermittent lightheadedness but denies chest pain or palpitations.  Denies any fevers or shaking chills.  She is no longer taking the oxycodone and only takes Valium occasionally.  She continues to take Paxil since October. No radiation of symptoms or modifying factors. Denies worsening depression symptoms.   Past Medical History:  Diagnosis Date  . Anxiety disorder   . Chronic pain   . Depression    major depressive disorder  . Fibromyalgia   . Pancreatitis     Patient Active Problem List   Diagnosis Date Noted  . Dysmenorrhea 08/02/2017  . Hot flashes 08/02/2017  . Mood swings 08/02/2017  . Irregular periods 08/02/2017  . Depression with anxiety 12/05/2016  . Chronic pain syndrome 12/05/2016  . Fibromyalgia 12/05/2016    Past Surgical History:  Procedure Laterality Date  . CERVICAL CONE BIOPSY      Current Outpatient Rx  . Order #: [id number] Class: Historical Med  . Order #: [id number] Class: Historical Med  . Order #: [id number] Class: Historical Med  . Order #: [id number] Class: Historical Med  . Order #: [id number] Class: Historical Med  . Order #: [id number] Class: Normal     Allergies Patient has no known allergies.  Family History  Problem Relation Age of Onset  . Heart disease Paternal Grandfather   . Dementia Paternal Grandmother   . Stroke Maternal Grandmother   . Diabetes Maternal Grandmother   . Arthritis Maternal Grandmother   . Cancer Maternal Grandfather        skin  . Hypertension Father   . Diabetes Father   . Heart disease Mother   . Diabetes Mother   . Arthritis Mother   . Cancer Mother        lung  . ADD / ADHD Son     Social History Social History   Tobacco Use  . Smoking status: Former Smoker    Types: Cigarettes  . Smokeless tobacco: Never Used  Substance Use Topics  . Alcohol use: Yes    Comment: ocassional  . Drug use: No    Review of Systems  Constitutional: No fever/chills. Positive generalized weakness.  Eyes: No visual changes. ENT: No sore throat. Cardiovascular: Denies chest pain. Respiratory: Denies shortness of breath. Gastrointestinal: No abdominal pain.  No nausea, no vomiting.  No diarrhea.  No constipation. Genitourinary: Negative for dysuria. Musculoskeletal: Negative for back pain. Skin: Negative for rash. Neurological: Negative for headaches, focal weakness or numbness.  10-point ROS otherwise negative.  ____________________________________________   PHYSICAL EXAM:  VITAL SIGNS: ED Triage Vitals [01/24/18 1411]  Enc Vitals Group     BP 135/76     Pulse Rate (!) 108  Resp 18     Temp 98.4 F (36.9 C)     Temp Source Oral     SpO2 100 %     Weight 150 lb (68 kg)     Height 5\' 4"  (1.626 m)     Pain Score 2   Constitutional: Alert and oriented. Well appearing and in no acute distress. Eyes: Conjunctivae are normal.  Head: Atraumatic. Nose: No congestion/rhinnorhea. Mouth/Throat: Mucous membranes are moist.  Neck: No stridor. No palpable thyroid mass or goiter.  Cardiovascular: Tachcyardia. Good peripheral circulation. Grossly normal heart sounds.   Respiratory: Normal  respiratory effort.  No retractions. Lungs CTAB. Gastrointestinal: No distention.  Musculoskeletal: No lower extremity tenderness nor edema. No gross deformities of extremities. Neurologic:  Normal speech and language. No gross focal neurologic deficits are appreciated.  Skin:  Skin is warm, dry and intact. No rash noted.  ____________________________________________   LABS (all labs ordered are listed, but only abnormal results are displayed)  Labs Reviewed  URINALYSIS, ROUTINE W REFLEX MICROSCOPIC - Abnormal; Notable for the following components:      Result Value   Hgb urine dipstick MODERATE (*)    Ketones, ur 5 (*)    Squamous Epithelial / LPF 0-5 (*)    All other components within normal limits  COMPREHENSIVE METABOLIC PANEL - Abnormal; Notable for the following components:   Glucose, Bld 128 (*)    All other components within normal limits  CBC WITH DIFFERENTIAL/PLATELET  PREGNANCY, URINE   ____________________________________________  EKG   EKG Interpretation  Date/Time:  Wednesday January 24 2018 15:27:24 EDT Ventricular Rate:  69 PR Interval:    QRS Duration: 89 QT Interval:  372 QTC Calculation: 399 R Axis:   79 Text Interpretation:  Sinus rhythm No STEMI.  Confirmed by Alona Bene [phone]) on 01/24/2018 3:31:32 PM       ____________________________________________  RADIOLOGY  None ____________________________________________   PROCEDURES  Procedure(s) performed:   Procedures  None ____________________________________________   INITIAL IMPRESSION / ASSESSMENT AND PLAN / ED COURSE  Pertinent labs & imaging results that were available during my care of the patient were reviewed by me and considered in my medical decision making (see chart for details).  Patient presents to the emergency department with generalized weakness and fatigue worsening over the past 2.5 months.  She has a follow-up appointment scheduled with endocrinology next month.   She is not describing any signs or having symptoms to suggest acute thyroid emergency.  She is afebrile.  She has mild tachycardia on arrival but no evidence of underlying infection, PE, or ACS.  Labs ordered in triage are largely unremarkable.  She will continue to follow with her PCP regarding notes and accommodations for her job.  She is awake, alert, ambulatory in the emergency department.   03:50 PM EKG reviewed with no acute findings. Hb on UA with no other acute findings. Plan for PCP follow up and ED return precautions discussed.   At this time, I do not feel there is any life-threatening condition present. I have reviewed and discussed all results (EKG, imaging, lab, urine as appropriate), exam findings with patient. I have reviewed nursing notes and appropriate previous records.  I feel the patient is safe to be discharged home without further emergent workup. Discussed usual and customary return precautions. Patient and family (if present) verbalize understanding and are comfortable with this plan.  Patient will follow-up with their primary care provider. If they do not have a primary care provider, information  for follow-up has been provided to them. All questions have been answered.  ____________________________________________  FINAL CLINICAL IMPRESSION(S) / ED DIAGNOSES  Final diagnoses:  Generalized weakness    Note:  This document was prepared using Dragon voice recognition software and may include unintentional dictation errors.  Alona Bene, MD Emergency Medicine    Nyeisha Goodall, Arlyss Repress, MD 01/24/18 [id number]

## 2018-02-19 ENCOUNTER — Other Ambulatory Visit: Payer: Self-pay | Admitting: "Endocrinology

## 2018-02-19 ENCOUNTER — Encounter: Payer: Self-pay | Admitting: "Endocrinology

## 2018-02-19 ENCOUNTER — Ambulatory Visit: Payer: BC Managed Care – PPO | Admitting: "Endocrinology

## 2018-02-19 VITALS — BP 122/80 | HR 87 | Ht 64.0 in | Wt 155.0 lb

## 2018-02-19 DIAGNOSIS — R7989 Other specified abnormal findings of blood chemistry: Secondary | ICD-10-CM | POA: Diagnosis not present

## 2018-02-19 HISTORY — DX: Other specified abnormal findings of blood chemistry: R79.89

## 2018-02-19 NOTE — Progress Notes (Signed)
Endocrinology Consult Note                                            02/19/2018, 5:10 PM   Subjective:    Patient ID: Tammy Howell, female    DOB: 1971-04-19, PCP Elfredia Nevins, MD   Past Medical History:  Diagnosis Date  . Anxiety disorder   . Chronic pain   . Depression    major depressive disorder  . Fibromyalgia   . Pancreatitis    Past Surgical History:  Procedure Laterality Date  . CERVICAL CONE BIOPSY     Social History   Socioeconomic History  . Marital status: Legally Separated    Spouse name: Not on file  . Number of children: Not on file  . Years of education: Not on file  . Highest education level: Not on file  Occupational History  . Not on file  Social Needs  . Financial resource strain: Not on file  . Food insecurity:    Worry: Not on file    Inability: Not on file  . Transportation needs:    Medical: Not on file    Non-medical: Not on file  Tobacco Use  . Smoking status: Former Smoker    Types: Cigarettes  . Smokeless tobacco: Never Used  Substance and Sexual Activity  . Alcohol use: Yes    Comment: ocassional  . Drug use: No  . Sexual activity: Not Currently    Birth control/protection: None  Lifestyle  . Physical activity:    Days per week: Not on file    Minutes per session: Not on file  . Stress: Not on file  Relationships  . Social connections:    Talks on phone: Not on file    Gets together: Not on file    Attends religious service: Not on file    Active member of club or organization: Not on file    Attends meetings of clubs or organizations: Not on file    Relationship status: Not on file  Other Topics Concern  . Not on file  Social History Narrative  . Not on file   Outpatient Encounter Medications as of 02/19/2018  Medication Sig  . cholecalciferol (VITAMIN D) 1000 units tablet Take 2,000 Units by mouth daily.  . cyclobenzaprine (FLEXERIL) 10 MG tablet Take 10 mg by mouth at bedtime.  . diazepam (VALIUM) 10  MG tablet Take 0.5-1 tablets by mouth daily as needed for anxiety.   . Multiple Vitamin (DAILY VITAMINS PO) Take by mouth daily.   Marland Kitchen PARoxetine (PAXIL) 10 MG tablet Take 1 tablet (10 mg total) by mouth daily.  . [DISCONTINUED] metoprolol tartrate (LOPRESSOR) 25 MG tablet Take 25 mg by mouth. 1-2 times daily  . [DISCONTINUED] oxyCODONE-acetaminophen (PERCOCET/ROXICET) 5-325 MG per tablet Take 1 tablet by mouth 4 (four) times daily as needed for moderate pain.    No facility-administered encounter medications on file as of 02/19/2018.    ALLERGIES: No Known Allergies  VACCINATION STATUS: Immunization History  Administered Date(s) Administered  . Influenza-Unspecified 10/30/2015    HPI Tammy Howell is 47 y.o. female who presents today with a medical history as above. she is being seen in consultation for suppressed TSH requested by Elfredia Nevins, MD.  -She denies any prior history of thyroid or adrenal dysfunction.  On December 15, 2017 she  was found to have suppressed p.m. cortisol, on repeat on December 21, 2017 she had normal a.m. cortisol at 11.8.  Her TSH was found to be suppressed at 0.17 on December 15, 2017.  She is not on antithyroid medications nor on thyroid hormone supplements. -She has family history of unidentified thyroid dysfunction in 1 of her grandparents however no family history of thyroid cancer.  -She complains of fatigue, proggressive weight gain of 5 pounds over the last 2 months, however denies heat/cold intolerance, palpitations. -He noticed some increased tremors of her hands. -In February, she was given a course of oral steroids related to her fibromyalgia.  Review of Systems  Constitutional: + weight gain, + fatigue, no subjective hyperthermia, no subjective hypothermia Eyes: no blurry vision, no xerophthalmia ENT: no sore throat, no nodules palpated in throat, no dysphagia/odynophagia, no hoarseness Cardiovascular: no Chest Pain, no Shortness of Breath, no  palpitations, no leg swelling Respiratory: no cough, no SOB Gastrointestinal: no Nausea/Vomiting/Diarhhea Musculoskeletal: no muscle/joint aches Skin: no rashes Neurological: + tremors, no numbness, no tingling, no dizziness Psychiatric: no depression, no anxiety  Objective:    BP 122/80   Pulse 87   Ht  (1.626 m)   Wt 155 lb (70.3 kg)   BMI 26.61 kg/m   Wt Readings from Last 3 Encounters:  02/19/18 155 lb (70.3 kg)  01/24/18 150 lb (68 kg)  09/11/17 151 lb (68.5 kg)    Physical Exam  Constitutional: + Appropriate weight for height, not in acute distress, normal state of mind Eyes: PERRLA, EOMI, no exophthalmos ENT: moist mucous membranes, no thyromegaly, no cervical lymphadenopathy Cardiovascular: normal precordial activity, Regular Rate and Rhythm, no Murmur/Rubs/Gallops Respiratory:  adequate breathing efforts, no gross chest deformity, Clear to auscultation bilaterally Gastrointestinal: abdomen soft, Non -tender, No distension, Bowel Sounds present Musculoskeletal: no gross deformities, strength intact in all four extremities Skin: moist, warm, no rashes Neurological: + tremor with outstretched hands more pronounced on right, Deep tendon reflexes normal in all four extremities.  CMP ( most recent) CMP     Component Value Date/Time   NA 139 01/24/2018 1419   K 3.6 01/24/2018 1419   CL 103 01/24/2018 1419   CO2 25 01/24/2018 1419   GLUCOSE 128 (H) 01/24/2018 1419   BUN 13 01/24/2018 1419   CREATININE 0.91 01/24/2018 1419   CALCIUM 9.4 01/24/2018 1419   PROT 7.4 01/24/2018 1419   ALBUMIN 4.5 01/24/2018 1419   AST 21 01/24/2018 1419   ALT 16 01/24/2018 1419   ALKPHOS 54 01/24/2018 1419   BILITOT 0.7 01/24/2018 1419   GFRNONAA >60 01/24/2018 1419   GFRAA >60 01/24/2018 1419   December 15, 2017 TSH 0.1 7 suppressed December 21, 2017 a.m. cortisol normal at 11.8    Assessment & Plan:   1. Abnormal thyroid blood test - Tammy Howell  is being seen at a kind  request of Elfredia Nevins, MD. - I have reviewed her available thyroid and and renal records and clinically evaluated the patient. - Based on reviews, she has subclinical hypothyroidism,  however,  there is not sufficient information to proceed with definitive treatment plan.  - she will need a repeat,  more complete thyroid function tests today towards confirming the diagnosis.  -she will return in 1 week to review her repeat labs.    If her  labs are suggestive, she will be considered for thyroid uptake and scan to confirm diagnosis.  - I did not initiate any new prescriptions today. -  She would not require repeat adrenal assessment, most recent a.m. cortisol is normal.  Her course of steroid therapy in February might have affected her first p.m. cortisol on December 15, 2017 which has since corrected. - I advised her  to maintain close follow up with Elfredia Nevins, MD for primary care needs.   - Time spent with the patient: 45 minutes, of which >50% was spent in obtaining information about her symptoms, reviewing her previous labs, evaluations, and treatments, counseling her about her abnormal thyroid function tests, and developing a plan to confirm the diagnosis and long term treatment as necessary.  Luisa Hart participated in the discussions, expressed understanding, and voiced agreement with the above plans.  All questions were answered to her satisfaction. she is encouraged to contact clinic should she have any questions or concerns prior to her return visit.  Follow up plan: Return in about 1 week (around 02/26/2018) for labs today, follow up with pre-visit labs.   Marquis Lunch, MD [facility] [address] [place], [place] [address] Phone: [phone]  Fax: [phone]     02/19/2018, 5:10 PM  This note was partially dictated with voice recognition software. Similar sounding words can be transcribed inadequately or may not   be corrected upon review.

## 2018-02-21 LAB — THYROGLOBULIN ANTIBODY: Thyroglobulin Antibody: 2 IU/mL — ABNORMAL HIGH (ref 0.0–0.9)

## 2018-02-21 LAB — T4, FREE: FREE T4: 1.17 ng/dL (ref 0.82–1.77)

## 2018-02-21 LAB — T3, FREE: T3 FREE: 2.4 pg/mL (ref 2.0–4.4)

## 2018-02-21 LAB — THYROID PEROXIDASE ANTIBODY: THYROID PEROXIDASE ANTIBODY: 11 [IU]/mL (ref 0–34)

## 2018-02-21 LAB — TSH: TSH: 0.157 u[IU]/mL — ABNORMAL LOW (ref 0.450–4.500)

## 2018-02-26 ENCOUNTER — Encounter: Payer: Self-pay | Admitting: "Endocrinology

## 2018-02-26 ENCOUNTER — Ambulatory Visit: Payer: BC Managed Care – PPO | Admitting: "Endocrinology

## 2018-02-26 VITALS — BP 123/84 | HR 74 | Ht 64.0 in | Wt 153.0 lb

## 2018-02-26 DIAGNOSIS — E0590 Thyrotoxicosis, unspecified without thyrotoxic crisis or storm: Secondary | ICD-10-CM

## 2018-02-26 MED ORDER — METHIMAZOLE 5 MG PO TABS: 5.0000 mg | ORAL_TABLET | Freq: Every day | ORAL | 3 refills | Status: DC

## 2018-02-26 NOTE — Progress Notes (Signed)
Endocrinology Consult Note                                            02/26/2018, 2:59 PM   Subjective:    Patient ID: Tammy Howell, female    DOB: 12-25-1970, PCP Elfredia Nevins, MD   Past Medical History:  Diagnosis Date  . Anxiety disorder   . Chronic pain   . Depression    major depressive disorder  . Fibromyalgia   . Pancreatitis    Past Surgical History:  Procedure Laterality Date  . CERVICAL CONE BIOPSY     Social History   Socioeconomic History  . Marital status: Legally Separated    Spouse name: Not on file  . Number of children: Not on file  . Years of education: Not on file  . Highest education level: Not on file  Occupational History  . Not on file  Social Needs  . Financial resource strain: Not on file  . Food insecurity:    Worry: Not on file    Inability: Not on file  . Transportation needs:    Medical: Not on file    Non-medical: Not on file  Tobacco Use  . Smoking status: Former Smoker    Types: Cigarettes  . Smokeless tobacco: Never Used  Substance and Sexual Activity  . Alcohol use: Yes    Comment: ocassional  . Drug use: No  . Sexual activity: Not Currently    Birth control/protection: None  Lifestyle  . Physical activity:    Days per week: Not on file    Minutes per session: Not on file  . Stress: Not on file  Relationships  . Social connections:    Talks on phone: Not on file    Gets together: Not on file    Attends religious service: Not on file    Active member of club or organization: Not on file    Attends meetings of clubs or organizations: Not on file    Relationship status: Not on file  Other Topics Concern  . Not on file  Social History Narrative  . Not on file   Outpatient Encounter Medications as of 02/26/2018  Medication Sig  . cholecalciferol (VITAMIN D) 1000 units tablet Take 2,000 Units by mouth daily.  . cyclobenzaprine (FLEXERIL) 10 MG tablet Take 10 mg by mouth at bedtime.  . diazepam (VALIUM)  10 MG tablet Take 0.5-1 tablets by mouth daily as needed for anxiety.   . methimazole (TAPAZOLE) 5 MG tablet Take 1 tablet (5 mg total) by mouth daily.  . Multiple Vitamin (DAILY VITAMINS PO) Take by mouth daily.   Marland Kitchen PARoxetine (PAXIL) 10 MG tablet Take 1 tablet (10 mg total) by mouth daily.   No facility-administered encounter medications on file as of 02/26/2018.    ALLERGIES: No Known Allergies  VACCINATION STATUS: Immunization History  Administered Date(s) Administered  . Influenza-Unspecified 10/30/2015    Tammy Howell is 47 y.o. female who presents today with a medical history as above. she is returning with full thyroid function test after she was seen in consultation for history of suppressed TSH.    -She brought a page of hand written note showing a series of suppressed TSH starting from 2012.  Her previsit repeat thyroid function test indicate subclinical hyperthyroidism with evidence of antithyroid autoantibodies.  She  is currently not on antithyroid or pro-thyroid medications. -She has family history of unidentified thyroid dysfunction in 1 of her grandparents however no family history of thyroid cancer.  -She has no new complaints today.  -He noticed some increased tremors of her hands. -In February, she was given a course of oral steroids related to her fibromyalgia.  Review of Systems  Constitutional: + steady  weight, + fatigue, no subjective hyperthermia, no subjective hypothermia Eyes: no blurry vision, no xerophthalmia ENT: no sore throat, no nodules palpated in throat, no dysphagia/odynophagia, no hoarseness Cardiovascular: no Chest Pain, no Shortness of Breath, no palpitations, no leg swelling Respiratory: no cough, no SOB Gastrointestinal: no Nausea/Vomiting/Diarhhea Musculoskeletal: no muscle/joint aches Skin: no rashes Neurological: + tremors, no numbness, no tingling, no dizziness Psychiatric: no depression, no anxiety  Objective:    BP 123/84    Pulse 74   Ht  (1.626 m)   Wt 153 lb (69.4 kg)   BMI 26.26 kg/m   Wt Readings from Last 3 Encounters:  02/26/18 153 lb (69.4 kg)  02/19/18 155 lb (70.3 kg)  01/24/18 150 lb (68 kg)    Physical Exam  Constitutional: + Appropriate weight for height, not in acute distress, normal state of mind Eyes: PERRLA, EOMI, no exophthalmos ENT: moist mucous membranes, no thyromegaly, no cervical lymphadenopathy Musculoskeletal: no gross deformities, strength intact in all four extremities Skin: moist, warm, no rashes Neurological: + tremor with outstretched hands more pronounced on right, Deep tendon reflexes normal in all four extremities.  CMP ( most recent) CMP     Component Value Date/Time   NA 139 01/24/2018 1419   K 3.6 01/24/2018 1419   CL 103 01/24/2018 1419   CO2 25 01/24/2018 1419   GLUCOSE 128 (H) 01/24/2018 1419   BUN 13 01/24/2018 1419   CREATININE 0.91 01/24/2018 1419   CALCIUM 9.4 01/24/2018 1419   PROT 7.4 01/24/2018 1419   ALBUMIN 4.5 01/24/2018 1419   AST 21 01/24/2018 1419   ALT 16 01/24/2018 1419   ALKPHOS 54 01/24/2018 1419   BILITOT 0.7 01/24/2018 1419   GFRNONAA >60 01/24/2018 1419   GFRAA >60 01/24/2018 1419   December 15, 2017 TSH 0.17 suppressed December 21, 2017 a.m. cortisol normal at 11.8  Results for SAVITA, RUNNER (MRN [id number]) as of 02/26/2018 14:29  Ref. Range 02/19/2018 15:54  TSH Latest Ref Range: 0.450 - 4.500 uIU/mL 0.157 (L)  Triiodothyronine,Free,Serum Latest Ref Range: 2.0 - 4.4 pg/mL 2.4  T4,Free(Direct) Latest Ref Range: 0.82 - 1.77 ng/dL 4.09  Thyroperoxidase Ab SerPl-aCnc Latest Ref Range: 0 - 34 IU/mL 11  Thyroglobulin Antibody Latest Ref Range: 0.0 - 0.9 IU/mL 2.0 (H)    She brought a page of her hand written note showing her suppressed TSH starting from 2012.  Assessment & Plan:   1.  Subclinical hyperthyroidism 2.  Antithyroid antibodies -Review of her more complete thyroid function test revealed subclinical hyperthyroidism  with evidence of antithyroid autoantibodies. -Given her subtle symptoms and signs of thyrotoxicosis, she will benefit from low-dose antithyroid therapy. -She will not require ablative treatment. -I discussed and initiated Tapazole 5 mg p.o. daily with plan to repeat thyroid function test in 3 months. - I advised her  to maintain close follow up with Elfredia Nevins, MD for primary care needs.  Follow up plan: Return in about 3 months (around 05/29/2018) for follow up with pre-visit labs.   Marquis Lunch, MD [facility] [address]  [place], [place] [address] Phone: [phone]  Fax: [phone]     02/26/2018, 2:59 PM  This note was partially dictated with voice recognition software. Similar sounding words can be transcribed inadequately or may not  be corrected upon review.

## 2018-05-30 ENCOUNTER — Ambulatory Visit: Payer: BC Managed Care – PPO | Admitting: "Endocrinology

## 2018-08-15 ENCOUNTER — Other Ambulatory Visit: Payer: Self-pay | Admitting: Women's Health

## 2018-08-23 ENCOUNTER — Other Ambulatory Visit: Payer: Self-pay | Admitting: "Endocrinology

## 2018-10-02 ENCOUNTER — Other Ambulatory Visit: Payer: Self-pay | Admitting: Women's Health

## 2019-02-15 ENCOUNTER — Ambulatory Visit (HOSPITAL_COMMUNITY)
Admission: RE | Admit: 2019-02-15 | Discharge: 2019-02-15 | Disposition: A | Discharge status: Home / Self Care | Payer: Self-pay | Source: Ambulatory Visit | Attending: Family Medicine | Admitting: Family Medicine

## 2019-02-15 ENCOUNTER — Other Ambulatory Visit (HOSPITAL_COMMUNITY): Payer: Self-pay | Admitting: Family Medicine

## 2019-02-15 ENCOUNTER — Other Ambulatory Visit: Payer: Self-pay

## 2019-02-15 DIAGNOSIS — M79644 Pain in right finger(s): Secondary | ICD-10-CM

## 2019-11-26 ENCOUNTER — Encounter: Payer: Self-pay | Admitting: Obstetrics and Gynecology

## 2019-11-26 ENCOUNTER — Other Ambulatory Visit: Payer: Self-pay

## 2019-11-26 ENCOUNTER — Other Ambulatory Visit (HOSPITAL_COMMUNITY)
Admission: RE | Admit: 2019-11-26 | Discharge: 2019-11-26 | Disposition: A | Discharge status: Home / Self Care | Payer: Medicaid Other | Source: Ambulatory Visit | Attending: Obstetrics and Gynecology | Admitting: Obstetrics and Gynecology

## 2019-11-26 ENCOUNTER — Ambulatory Visit (INDEPENDENT_AMBULATORY_CARE_PROVIDER_SITE_OTHER): Payer: Medicaid Other | Admitting: Obstetrics and Gynecology

## 2019-11-26 VITALS — BP 130/73 | HR 90 | Ht 64.75 in | Wt 168.2 lb

## 2019-11-26 DIAGNOSIS — Z0000 Encounter for general adult medical examination without abnormal findings: Secondary | ICD-10-CM | POA: Diagnosis present

## 2019-11-26 DIAGNOSIS — Z1211 Encounter for screening for malignant neoplasm of colon: Secondary | ICD-10-CM

## 2019-11-26 DIAGNOSIS — Z1212 Encounter for screening for malignant neoplasm of rectum: Secondary | ICD-10-CM | POA: Diagnosis not present

## 2019-11-26 LAB — HEMOCCULT GUIAC POC 1CARD (OFFICE): Fecal Occult Blood, POC: NEGATIVE

## 2019-11-26 MED ORDER — PAROXETINE HCL 10 MG PO TABS: 10.0000 mg | ORAL_TABLET | Freq: Every day | ORAL | 12 refills | Status: DC

## 2019-11-26 NOTE — Addendum Note (Signed)
Addended by: Colen Darling on: 11/26/2019 11:56 AM   Modules accepted: Orders

## 2019-11-26 NOTE — Progress Notes (Signed)
Patient ID: Tammy Howell, female   DOB: 09-05-1971, 49 y.o.   MRN: [id number]  Assessment:  Annual Gyn Exam GCCHL collected Postmenopausal Medication management of Rx Paxil Plan:  1. pap smear done, next pap due 3 years 2. return annually or prn 3    Annual mammogram advised after age 61 Subjective:  Tammy Howell is a 49 y.o. female [phone] who presents for annual exam. Patient's last menstrual period was 08/27/2017. The patient has complaints today of none. She has a new boyfriend and is sexually active. She hasn't had a period since October 2019, with occasional dryness, non worrisome at the moment. She would like to refill her Paxil, she notices more anxiety and mind wandering.   The following portions of the patient's history were reviewed and updated as appropriate: allergies, current medications, past family history, past medical history, past social history, past surgical history and problem list. Past Medical History:  Diagnosis Date  . Anxiety disorder   . Chronic pain   . Depression    major depressive disorder  . Fibromyalgia   . Pancreatitis     Past Surgical History:  Procedure Laterality Date  . CERVICAL CONE BIOPSY       Current Outpatient Medications:  .  methimazole (TAPAZOLE) 5 MG tablet, TAKE 1 TABLET ONCE DAILY., Disp: 30 tablet, Rfl: 0 .  Multiple Vitamin (DAILY VITAMINS PO), Take by mouth. , Disp: , Rfl:  .  PARoxetine (PAXIL) 10 MG tablet, TAKE 1 TABLET ONCE DAILY., Disp: 30 tablet, Rfl: 0  Review of Systems Constitutional: negative Gastrointestinal: negative Genitourinary: normal  Objective:  BP 130/73 (BP Location: Right Arm, Patient Position: Sitting, Cuff Size: Large)   Pulse 90   Ht 5' 4.75" (1.645 m)   Wt 168 lb 3.2 oz (76.3 kg)   LMP 08/27/2017   BMI 28.21 kg/m    BMI: Body mass index is 28.21 kg/m.  General Appearance: Alert, appropriate appearance for age. No acute distress HEENT: Grossly normal Neck / Thyroid:  Cardiovascular:  RRR; normal S1, S2, no murmur Lungs: CTA bilaterally Back: No CVAT Breast Exam: Not examined Gastrointestinal: Soft, non-tender, no masses or organomegaly Pelvic Exam: VAGINA: atrophic CERVIX: normal appearing cervix s/p conization in her 79s UTERUS: uterus is small, mobile and non tender RECTAL: guaiac negative stool obtained PAP: Pap smear done today. GCC[facility] done.  Lymphatic Exam: Non-palpable nodes in neck, clavicular, axillary, or inguinal regions Skin: no rash or abnormalities Neurologic: Normal gait and speech, no tremor  Psychiatric: Alert and oriented, appropriate affect.  Urinalysis:Not done  By signing my name below, I, Arnette Norris, attest that this documentation has been prepared under the direction and in the presence of Tilda Burrow, MD. Electronically Signed: Arnette Norris Medical Scribe. 11/26/19. 11:06 AM.  I personally performed the services described in this documentation, which was SCRIBED in my presence. The recorded information has been reviewed and considered accurate. It has been edited as necessary during review. Tilda Burrow, MD

## 2019-12-02 LAB — CYTOLOGY - PAP
Comment: NEGATIVE
Comment: NEGATIVE
HPV 16: NEGATIVE
HPV 18 / 45: NEGATIVE
High risk HPV: POSITIVE — AB

## 2023-03-01 ENCOUNTER — Encounter: Payer: Self-pay | Admitting: Adult Health

## 2023-03-01 ENCOUNTER — Other Ambulatory Visit (HOSPITAL_COMMUNITY)
Admission: RE | Admit: 2023-03-01 | Discharge: 2023-03-01 | Disposition: A | Discharge status: Home / Self Care | Payer: Medicaid Other | Source: Ambulatory Visit | Attending: Adult Health | Admitting: Adult Health

## 2023-03-01 ENCOUNTER — Encounter: Payer: Self-pay | Admitting: *Deleted

## 2023-03-01 ENCOUNTER — Ambulatory Visit (INDEPENDENT_AMBULATORY_CARE_PROVIDER_SITE_OTHER): Payer: Medicaid Other | Admitting: Adult Health

## 2023-03-01 VITALS — BP 125/78 | HR 66 | Ht 64.0 in | Wt 151.0 lb

## 2023-03-01 DIAGNOSIS — Z0000 Encounter for general adult medical examination without abnormal findings: Secondary | ICD-10-CM

## 2023-03-01 DIAGNOSIS — Z1231 Encounter for screening mammogram for malignant neoplasm of breast: Secondary | ICD-10-CM

## 2023-03-01 DIAGNOSIS — F17200 Nicotine dependence, unspecified, uncomplicated: Secondary | ICD-10-CM | POA: Insufficient documentation

## 2023-03-01 DIAGNOSIS — Z1211 Encounter for screening for malignant neoplasm of colon: Secondary | ICD-10-CM | POA: Diagnosis not present

## 2023-03-01 DIAGNOSIS — Z01419 Encounter for gynecological examination (general) (routine) without abnormal findings: Secondary | ICD-10-CM

## 2023-03-01 DIAGNOSIS — R6882 Decreased libido: Secondary | ICD-10-CM | POA: Diagnosis not present

## 2023-03-01 DIAGNOSIS — Z8742 Personal history of other diseases of the female genital tract: Secondary | ICD-10-CM | POA: Diagnosis not present

## 2023-03-01 DIAGNOSIS — R232 Flushing: Secondary | ICD-10-CM | POA: Diagnosis not present

## 2023-03-01 DIAGNOSIS — Z1212 Encounter for screening for malignant neoplasm of rectum: Secondary | ICD-10-CM

## 2023-03-01 HISTORY — DX: Decreased libido: R68.82

## 2023-03-01 HISTORY — DX: Encounter for gynecological examination (general) (routine) without abnormal findings: Z01.419

## 2023-03-01 LAB — HEMOCCULT GUIAC POC 1CARD (OFFICE): Fecal Occult Blood, POC: NEGATIVE

## 2023-03-01 MED ORDER — ESTRADIOL 1 MG PO TABS: 1.0000 mg | ORAL_TABLET | Freq: Every day | ORAL | 3 refills | Status: DC

## 2023-03-01 MED ORDER — PROGESTERONE 200 MG PO CAPS: ORAL_CAPSULE | ORAL | 3 refills | Status: DC

## 2023-03-01 NOTE — Progress Notes (Signed)
Patient ID: Tammy Howell, female   DOB: 1971/04/26, 52 y.o.   MRN: [id number] History of Present Illness: Tammy Howell is a 52 year old white female, divorced, PM in for a well woman gyn exam and pap.  Her last pap was LSIL +HPV 11/26/19 and she did not get colpo. She had no insurance,til recently.  She is complaining of hot flashes, forgetting things and no sex drive, and trouble going to sleep.  PCP is Dr Sherwood Gambler.    Current Medications, Allergies, Past Medical History, Past Surgical History, Family History and Social History were reviewed in [employer] electronic medical record.     Review of Systems: Patient denies any headaches, hearing loss, fatigue, blurred vision, shortness of breath, chest pain, abdominal pain, problems with bowel movements, urination, or intercourse. No joint pain or mood swings.  See HPI for positives.   Physical Exam:BP 125/78 (BP Location: Left Arm, Patient Position: Sitting, Cuff Size: Normal)   Pulse 66   Ht 5\' 4"  (1.626 m)   Wt 151 lb (68.5 kg)   LMP 07/31/2017   BMI 25.92 kg/m   General:  Well developed, well nourished, no acute distress Skin:  Warm and dry Neck:  Midline trachea, normal thyroid, good ROM, no lymphadenopathy Lungs; Clear to auscultation bilaterally Breast:  No dominant palpable mass, retraction, or nipple discharge Cardiovascular: Regular rate and rhythm Abdomen:  Soft, non tender, no hepatosplenomegaly Pelvic:  External genitalia is normal in appearance, no lesions.  The vagina is pale with loss or moisture and rugae. Urethra has no lesions or masses. The cervix is irregular at os, pap with HR HPV genotyping and GC/CHL performed .  Uterus is felt to be normal size, shape, and contour.  No adnexal masses or tenderness noted.Bladder is non tender, no masses felt. Rectal: Good sphincter tone, no polyps, or hemorrhoids felt.  Hemoccult negative. Extremities/musculoskeletal:  No swelling or varicosities noted, no clubbing or cyanosis Psych:   No mood changes, alert and cooperative,seems happy AA is 2 Fall risk is low    03/01/2023   11:29 AM 11/26/2019   11:04 AM 02/19/2018    3:19 PM  Depression screen PHQ 2/9  Decreased Interest 0 0   Down, Depressed, Hopeless 0 0   PHQ - 2 Score 0 0   Altered sleeping 1    Tired, decreased energy 0    Change in appetite 0    Feeling bad or failure about yourself  0    Trouble concentrating 0    Moving slowly or fidgety/restless 0    Suicidal thoughts 0    PHQ-9 Score 1       Information is confidential and restricted. Go to Review Flowsheets to unlock data.       03/01/2023   11:29 AM  GAD 7 : Generalized Anxiety Score  Nervous, Anxious, on Edge 0  Control/stop worrying 0  Worry too much - different things 0  Trouble relaxing 0  Restless 0  Easily annoyed or irritable 0  Afraid - awful might happen 0  Total GAD 7 Score 0      Upstream - 03/01/23 1135       Pregnancy Intention Screening   Does the patient want to become pregnant in the next year? N/A    Does the patient's partner want to become pregnant in the next year? N/A    Would the patient like to discuss contraceptive options today? N/A      Contraception Wrap Up   Current  Method No Method - Other Reason   postmenopausal   Reason for No Current Contraceptive Method at Intake ([facility] Only) Other    End Method No Method - Other Reason   postmenopausal   Contraception Counseling Provided No            Examination chaperoned by Malachy Mood LPN   Impression and plan:  1. Routine general medical examination at a health care facility Pap sent Pap in 3 years if normal Physical in 1 year - Cytology - PAP( [facility]) To see PCP for labs  2. Encounter for routine gynecological examination with Papanicolaou smear of cervix Pap sent  3. Hot flashes +hot flashes She denies MI,stroke, DVT or breast cancer Will try HRT Meds ordered this encounter  Medications   estradiol (ESTRACE) 1 MG tablet    Sig: Take 1  tablet (1 mg total) by mouth daily.    Dispense:  30 tablet    Refill:  3    Order Specific Question:   Supervising Provider    Answer:   Duane Lope H [[id number]]   progesterone (PROMETRIUM) 200 MG capsule    Sig: Take 1 at bedtime daily    Dispense:  30 capsule    Refill:  3    Order Specific Question:   Supervising Provider    Answer:   Duane Lope H [[id number]]   Follow up in 3 months for ROS  4. Decreased libido Has no sex drive Will address again in 3 months, may add testosterone if stopped smoking   5. History of abnormal cervical Pap smear Pap sent  6. Screening mammogram for breast cancer Mammogram scheduled for 03/08/23 at 1:45 pm at [facility] - MM 3D SCREENING MAMMOGRAM BILATERAL BREAST; Future  7. Screening for colorectal cancer Refer to Dr Jena Gauss for colonoscopy  - Ambulatory referral to Gastroenterology  8. Encounter for screening fecal occult blood testing Hemoccult negative  - POCT occult blood stool  9. Smoker Try to stop smoking

## 2023-03-07 LAB — CYTOLOGY - PAP
Chlamydia: NEGATIVE
Comment: NEGATIVE
Comment: NEGATIVE
Comment: NORMAL
Diagnosis: NEGATIVE
High risk HPV: NEGATIVE
Neisseria Gonorrhea: NEGATIVE

## 2023-03-08 ENCOUNTER — Ambulatory Visit (HOSPITAL_COMMUNITY)
Admission: RE | Admit: 2023-03-08 | Discharge: 2023-03-08 | Disposition: A | Discharge status: Home / Self Care | Payer: Medicaid Other | Source: Ambulatory Visit | Attending: Adult Health | Admitting: Adult Health

## 2023-03-08 DIAGNOSIS — Z1231 Encounter for screening mammogram for malignant neoplasm of breast: Secondary | ICD-10-CM | POA: Diagnosis present

## 2023-03-10 ENCOUNTER — Other Ambulatory Visit (HOSPITAL_COMMUNITY): Payer: Self-pay | Admitting: Adult Health

## 2023-03-10 DIAGNOSIS — R928 Other abnormal and inconclusive findings on diagnostic imaging of breast: Secondary | ICD-10-CM

## 2023-04-04 ENCOUNTER — Ambulatory Visit (HOSPITAL_COMMUNITY)
Admission: RE | Admit: 2023-04-04 | Discharge: 2023-04-04 | Disposition: A | Discharge status: Home / Self Care | Payer: Medicaid Other | Source: Ambulatory Visit | Attending: Adult Health | Admitting: Adult Health

## 2023-04-04 DIAGNOSIS — R928 Other abnormal and inconclusive findings on diagnostic imaging of breast: Secondary | ICD-10-CM | POA: Diagnosis present

## 2023-05-31 ENCOUNTER — Encounter: Payer: Self-pay | Admitting: Adult Health

## 2023-05-31 ENCOUNTER — Ambulatory Visit (INDEPENDENT_AMBULATORY_CARE_PROVIDER_SITE_OTHER): Payer: Medicaid Other | Admitting: Adult Health

## 2023-05-31 VITALS — BP 112/71 | HR 89 | Ht 64.0 in | Wt 144.0 lb

## 2023-05-31 DIAGNOSIS — R232 Flushing: Secondary | ICD-10-CM | POA: Diagnosis not present

## 2023-05-31 DIAGNOSIS — R6882 Decreased libido: Secondary | ICD-10-CM | POA: Diagnosis not present

## 2023-05-31 DIAGNOSIS — Z79890 Hormone replacement therapy (postmenopausal): Secondary | ICD-10-CM | POA: Insufficient documentation

## 2023-05-31 HISTORY — DX: Hormone replacement therapy: Z79.890

## 2023-05-31 MED ORDER — ESTRADIOL 1 MG PO TABS: 1.0000 mg | ORAL_TABLET | Freq: Every day | ORAL | 3 refills | Status: DC

## 2023-05-31 MED ORDER — PROGESTERONE 200 MG PO CAPS: ORAL_CAPSULE | ORAL | 3 refills | Status: DC

## 2023-05-31 NOTE — Progress Notes (Signed)
  Subjective:     Patient ID: Tammy Howell, female   DOB: 1971/03/18, 52 y.o.   MRN: [id number]  HPI Tammy Howell is a 52 year old white female,divorced, PM back in for follow up on starting estrace and Prometrium in May for hot flashes, not sleeping well, decreased libido, and being forgetful, and she says she is much better, the hot flashes are gone, and sleeping is much better.      Component Value Date/Time   DIAGPAP  03/01/2023 1136    - Negative for intraepithelial lesion or malignancy (NILM)   DIAGPAP - Low grade squamous intraepithelial lesion (LSIL) (A) 11/26/2019 1105   DIAGPAP  12/01/2016 0000    NEGATIVE FOR INTRAEPITHELIAL LESIONS OR MALIGNANCY.   HPVHIGH Negative 03/01/2023 1136   HPVHIGH Positive (A) 11/26/2019 1105   ADEQPAP  03/01/2023 1136    Satisfactory for evaluation; transformation zone component PRESENT.   ADEQPAP  11/26/2019 1105    Satisfactory for evaluation; transformation zone component PRESENT.   ADEQPAP  12/01/2016 0000    Satisfactory for evaluation  endocervical/transformation zone component PRESENT.    PCP is Dr Sherwood Gambler  Review of Systems No hot flashes Sleeping better Libido is better Not as forgetful  Reviewed past medical,surgical, social and family history. Reviewed medications and allergies.     Objective:   Physical Exam BP 112/71 (BP Location: Left Arm, Patient Position: Sitting, Cuff Size: Normal)   Pulse 89   Ht 5\' 4"  (1.626 m)   Wt 144 lb (65.3 kg)   LMP 07/31/2017   BMI 24.72 kg/m     Skin warm and dry.  Lungs: clear to ausculation bilaterally. Cardiovascular: regular rate and rhythm.  Fall risk is low  Upstream - 05/31/23 1345       Pregnancy Intention Screening   Does the patient want to become pregnant in the next year? N/A    Does the patient's partner want to become pregnant in the next year? N/A    Would the patient like to discuss contraceptive options today? N/A      Contraception Wrap Up   Current Method No Method - Other  Reason   postmenopausal   Reason for No Current Contraceptive Method at Intake ([facility] Only) Other    End Method No Method - Other Reason   postmenopausal   Contraception Counseling Provided No             Assessment:     1. Hot flashes Have resolved with HRT  2. Decreased libido Getting better on HRT  3. Hormone replacement therapy (HRT) Happy with how she feels, will refill estrace and Prometrium  Meds ordered this encounter  Medications   progesterone (PROMETRIUM) 200 MG capsule    Sig: Take 1 at bedtime daily    Dispense:  90 capsule    Refill:  3    Order Specific Question:   Supervising Provider    Answer:   Duane Lope H [[id number]]   estradiol (ESTRACE) 1 MG tablet    Sig: Take 1 tablet (1 mg total) by mouth daily.    Dispense:  90 tablet    Refill:  3    Order Specific Question:   Supervising Provider    Answer:   Lazaro Arms [[id number]]       Plan:     Physical in 1 year

## 2023-06-01 ENCOUNTER — Ambulatory Visit: Payer: Medicaid Other | Admitting: Adult Health

## 2023-06-19 ENCOUNTER — Other Ambulatory Visit: Payer: Self-pay | Admitting: Adult Health

## 2024-04-08 ENCOUNTER — Emergency Department (HOSPITAL_COMMUNITY)

## 2024-04-08 ENCOUNTER — Other Ambulatory Visit: Payer: Self-pay

## 2024-04-08 ENCOUNTER — Emergency Department (HOSPITAL_COMMUNITY)
Admission: EM | Admit: 2024-04-08 | Discharge: 2024-04-08 | Disposition: A | Discharge status: Home / Self Care | Attending: Emergency Medicine | Admitting: Emergency Medicine

## 2024-04-08 ENCOUNTER — Encounter (HOSPITAL_COMMUNITY): Payer: Self-pay

## 2024-04-08 DIAGNOSIS — R0602 Shortness of breath: Secondary | ICD-10-CM | POA: Diagnosis present

## 2024-04-08 DIAGNOSIS — Z7951 Long term (current) use of inhaled steroids: Secondary | ICD-10-CM | POA: Insufficient documentation

## 2024-04-08 DIAGNOSIS — J45901 Unspecified asthma with (acute) exacerbation: Secondary | ICD-10-CM | POA: Insufficient documentation

## 2024-04-08 DIAGNOSIS — R059 Cough, unspecified: Secondary | ICD-10-CM | POA: Insufficient documentation

## 2024-04-08 LAB — BASIC METABOLIC PANEL WITH GFR
Anion gap: 11 (ref 5–15)
BUN: 13 mg/dL (ref 6–20)
CO2: 22 mmol/L (ref 22–32)
Calcium: 8.6 mg/dL — ABNORMAL LOW (ref 8.9–10.3)
Chloride: 107 mmol/L (ref 98–111)
Creatinine, Ser: 0.95 mg/dL (ref 0.44–1.00)
GFR, Estimated: >60 mL/min (ref >60)
Glucose, Bld: 114 mg/dL — ABNORMAL HIGH (ref 70–99)
Potassium: 3.6 mmol/L (ref 3.5–5.1)
Sodium: 140 mmol/L (ref 135–145)

## 2024-04-08 LAB — CBC WITH DIFFERENTIAL/PLATELET
Abs Immature Granulocytes: 0.01 10*3/uL (ref 0.00–0.07)
Basophils Absolute: 0.1 10*3/uL (ref 0.0–0.1)
Basophils Relative: 1 %
Eosinophils Absolute: 0.8 10*3/uL — ABNORMAL HIGH (ref 0.0–0.5)
Eosinophils Relative: 9 %
HCT: 39.3 % (ref 36.0–46.0)
Hemoglobin: 13.6 g/dL (ref 12.0–15.0)
Immature Granulocytes: 0 %
Lymphocytes Relative: 27 %
Lymphs Abs: 2.3 10*3/uL (ref 0.7–4.0)
MCH: 31.2 pg (ref 26.0–34.0)
MCHC: 34.6 g/dL (ref 30.0–36.0)
MCV: 90.1 fL (ref 80.0–100.0)
Monocytes Absolute: 0.6 10*3/uL (ref 0.1–1.0)
Monocytes Relative: 7 %
Neutro Abs: 4.7 10*3/uL (ref 1.7–7.7)
Neutrophils Relative %: 56 %
Platelets: 286 10*3/uL (ref 150–400)
RBC: 4.36 MIL/uL (ref 3.87–5.11)
RDW: 12.6 % (ref 11.5–15.5)
WBC: 8.4 10*3/uL (ref 4.0–10.5)
nRBC: 0 % (ref 0.0–0.2)

## 2024-04-08 LAB — BRAIN NATRIURETIC PEPTIDE: B Natriuretic Peptide: 17 pg/mL (ref 0.0–100.0)

## 2024-04-08 LAB — RESP PANEL BY RT-PCR (RSV, FLU A&B, COVID)  RVPGX2
Influenza A by PCR: NEGATIVE
Influenza B by PCR: NEGATIVE
Resp Syncytial Virus by PCR: NEGATIVE
SARS Coronavirus 2 by RT PCR: NEGATIVE

## 2024-04-08 MED ORDER — IPRATROPIUM-ALBUTEROL 0.5-2.5 (3) MG/3ML IN SOLN
3.0000 mL | RESPIRATORY_TRACT | Status: AC
  Administered 2024-04-08 (×2): 3 mL via RESPIRATORY_TRACT
  Filled 2024-04-08: qty 6

## 2024-04-08 MED ORDER — METHYLPREDNISOLONE SODIUM SUCC 125 MG IJ SOLR
125.0000 mg | Freq: Once | INTRAMUSCULAR | Status: AC
  Administered 2024-04-08: 125 mg via INTRAVENOUS
  Filled 2024-04-08: qty 2

## 2024-04-08 MED ORDER — VENTOLIN HFA 108 (90 BASE) MCG/ACT IN AERS: 1.0000 | INHALATION_SPRAY | Freq: Four times a day (QID) | RESPIRATORY_TRACT | 2 refills | Status: DC | PRN

## 2024-04-08 MED ORDER — PREDNISONE 20 MG PO TABS: 40.0000 mg | ORAL_TABLET | Freq: Every day | ORAL | 0 refills | Status: AC

## 2024-04-08 MED ORDER — BUDESONIDE-FORMOTEROL FUMARATE 80-4.5 MCG/ACT IN AERO: 2.0000 | INHALATION_SPRAY | Freq: Two times a day (BID) | RESPIRATORY_TRACT | 12 refills | Status: AC

## 2024-04-08 NOTE — ED Provider Notes (Signed)
 [facility] EMERGENCY DEPARTMENT AT [facility] Provider Note   CSN: [id number] Arrival date & time: 04/08/24  2010     History  Chief Complaint  Patient presents with   Shortness of Breath    Tammy Howell is a 53 y.o. female with PMH as listed below who presents with shortness of breath and wheezing and cough productive of white sputum that started a couple of weeks ago after moving into a old house with a lot of attic dust that she thinks is silica dust.  Had received meds from [facility] last year including Advair and an inhaler which helped her a lot but she has been without health insurance for a while and was not able to obtain these medications are currently she has no inhalers or any medications for her. Patient recently moved back into the house and shortness of breath started again.  Worsening of the last couple of days.  Denies any leg swelling, chest pain, fever/chills, nausea vomiting diarrhea, other flulike symptoms, history of DVT or PE.   Past Medical History:  Diagnosis Date   Abnormal thyroid  blood test 02/19/2018   Anxiety disorder    Chronic pain    Chronic pain syndrome 12/05/2016   Decreased libido 03/01/2023   Depression    major depressive disorder   Depression with anxiety 12/05/2016   Encounter for routine gynecological examination with Papanicolaou smear of cervix 03/01/2023   Fibromyalgia    Hormone replacement therapy (HRT) 05/31/2023   Mood swings 08/02/2017   Pancreatitis        Home Medications Prior to Admission medications   Medication Sig Start Date End Date Taking? Authorizing Provider  albuterol (VENTOLIN HFA) 108 (90 Base) MCG/ACT inhaler Inhale 1-2 puffs into the lungs every 6 (six) hours as needed for wheezing or shortness of breath. 04/08/24 05/08/24 Yes Franklyn Sid SAILOR, MD  budesonide-formoterol (SYMBICORT) 80-4.5 MCG/ACT inhaler Inhale 2 puffs into the lungs 2 (two) times daily. 04/08/24  Yes Franklyn Sid SAILOR, MD  predniSONE  (DELTASONE) 20 MG tablet Take 2 tablets (40 mg total) by mouth daily for 5 days. 04/08/24 04/13/24 Yes Franklyn Sid SAILOR, MD  estradiol  (ESTRACE ) 1 MG tablet TAKE 1 TABLET(1 MG) BY MOUTH DAILY 06/20/23   Signa Delon LABOR, NP  progesterone  (PROMETRIUM ) 200 MG capsule TAKE ONE CAPSULE BY MOUTH EVERY NIGHT AT BEDTIME 06/20/23   Signa Delon LABOR, NP      Allergies    Patient has no known allergies.    Review of Systems   Review of Systems A 10 point review of systems was performed and is negative unless otherwise reported in HPI.  Physical Exam Updated Vital Signs BP (!) 148/98   Pulse 89   Resp 14   Ht 5' 4 (1.626 m)   Wt 65.3 kg   LMP 07/31/2017   SpO2 97%   BMI 24.71 kg/m  Physical Exam General: Normal appearing female, lying in bed.  HEENT: Sclera anicteric, MMM, trachea midline.  Cardiology: RRR, no murmurs/rubs/gallops.  Resp: Mild tachypnea with bilateral expiratory wheezing Abd: Soft, non-tender, non-distended. No rebound tenderness or guarding.  GU: Deferred. MSK: No peripheral edema or signs of trauma. Extremities without deformity or TTP. No cyanosis or clubbing. Skin: warm, dry.  Neuro: A&Ox4, CNs II-XII grossly intact. MAEs. Sensation grossly intact.  Psych: Normal mood and affect.   ED Results / Procedures / Treatments   Labs (all labs ordered are listed, but only abnormal results are displayed) Labs Reviewed  CBC  WITH DIFFERENTIAL/PLATELET - Abnormal; Notable for the following components:      Result Value   Eosinophils Absolute 0.8 (*)    All other components within normal limits  BASIC METABOLIC PANEL WITH GFR - Abnormal; Notable for the following components:   Glucose, Bld 114 (*)    Calcium 8.6 (*)    All other components within normal limits  RESP PANEL BY RT-PCR (RSV, FLU A&B, COVID)  RVPGX2  BRAIN NATRIURETIC PEPTIDE    EKG None  Radiology DG Chest 2 View Result Date: 04/08/2024 CLINICAL DATA:  Shortness of breath, cough EXAM: CHEST - 2 VIEW  COMPARISON:  01/28/2015 FINDINGS: The heart size and mediastinal contours are within normal limits. Both lungs are clear. The visualized skeletal structures are unremarkable. IMPRESSION: No active cardiopulmonary disease. Electronically Signed   By: Franky Crease M.D.   On: 04/08/2024 21:07    Procedures Procedures    Medications Ordered in ED Medications  ipratropium-albuterol (DUONEB) 0.5-2.5 (3) MG/3ML nebulizer solution 3 mL (3 mLs Nebulization Given 04/08/24 2129)  methylPREDNISolone sodium succinate (SOLU-MEDROL) 125 mg/2 mL injection 125 mg (125 mg Intravenous Given 04/08/24 2107)    ED Course/ Medical Decision Making/ A&P                          Medical Decision Making Amount and/or Complexity of Data Reviewed Labs: ordered. Decision-making details documented in ED Course. Radiology: ordered.  Risk Prescription drug management.    This patient presents to the ED for concern of wheezing, shortness of breath, this involves an extensive number of treatment options, and is a complaint that carries with it a high risk of complications and morbidity.  I considered the following differential and admission for this acute, potentially life threatening condition.   MDM:    DDX for dyspnea includes but is not limited to:   Patient with asthma exacerbation.  Given DuoNebs and Solu-Medrol with improvement of her symptoms. BNP within normal limits, no leg swelling, no history of heart failure, no concern for heart failure exacerbation.  Chest x-ray does not demonstrate any focal consolidation, pneumothorax, pulmonary edema, or pleural effusion.  No asymmetric leg swelling or erythema, no lower clinical concern for PE.   Clinical Course as of 04/08/24 2325  Mon Apr 08, 2024  2136 B Natriuretic Peptide: 17.0 neg [HN]  2136 CBC/BMP unremarkable in context of presentation [HN]  2141 Patient much improved after breathing treatments.  No residual wheezing.  States she feels much better.  [HN]    Clinical Course User Index [HN] Franklyn Sid SAILOR, MD   Will prescribe patient prednisone 40 mg x 5 days for acute asthma exacerbation.  Patient states she has no inhalers at home.  Will prescribe both albuterol rescue inhaler as well as a Symbicort daily inhaler.  She is instructed to follow-up with her primary care physician within 1 week for reevaluation.   Labs: I Ordered, and personally interpreted labs.  The pertinent results include:  those listed above  Imaging Studies ordered: I ordered imaging studies including CXR I independently visualized and interpreted imaging. I agree with the radiologist interpretation  Additional history obtained from chart review.   Reevaluation: After the interventions noted above, I reevaluated the patient and found that they have :improved  Social Determinants of Health: Lives independently  Disposition: I considered admission for this patient but believe she is stable for outpatient follow-up. DC w/ discharge instructions/return precautions. All questions answered to patient's satisfaction.  Co morbidities that complicate the patient evaluation  Past Medical History:  Diagnosis Date   Abnormal thyroid  blood test 02/19/2018   Anxiety disorder    Chronic pain    Chronic pain syndrome 12/05/2016   Decreased libido 03/01/2023   Depression    major depressive disorder   Depression with anxiety 12/05/2016   Encounter for routine gynecological examination with Papanicolaou smear of cervix 03/01/2023   Fibromyalgia    Hormone replacement therapy (HRT) 05/31/2023   Mood swings 08/02/2017   Pancreatitis      Medicines Meds ordered this encounter  Medications   ipratropium-albuterol (DUONEB) 0.5-2.5 (3) MG/3ML nebulizer solution 3 mL   methylPREDNISolone sodium succinate (SOLU-MEDROL) 125 mg/2 mL injection 125 mg   predniSONE (DELTASONE) 20 MG tablet    Sig: Take 2 tablets (40 mg total) by mouth daily for 5 days.    Dispense:   10 tablet    Refill:  0   albuterol (VENTOLIN HFA) 108 (90 Base) MCG/ACT inhaler    Sig: Inhale 1-2 puffs into the lungs every 6 (six) hours as needed for wheezing or shortness of breath.    Dispense:  18 g    Refill:  2   budesonide-formoterol (SYMBICORT) 80-4.5 MCG/ACT inhaler    Sig: Inhale 2 puffs into the lungs 2 (two) times daily.    Dispense:  1 each    Refill:  12    I have reviewed the patients home medicines and have made adjustments as needed  Problem List / ED Course: Problem List Items Addressed This Visit   None Visit Diagnoses       Exacerbation of asthma, unspecified asthma severity, unspecified whether persistent    -  Primary   Relevant Medications   ipratropium-albuterol (DUONEB) 0.5-2.5 (3) MG/3ML nebulizer solution 3 mL (Completed)   methylPREDNISolone sodium succinate (SOLU-MEDROL) 125 mg/2 mL injection 125 mg (Completed)   predniSONE (DELTASONE) 20 MG tablet   albuterol (VENTOLIN HFA) 108 (90 Base) MCG/ACT inhaler   budesonide-formoterol (SYMBICORT) 80-4.5 MCG/ACT inhaler                   This note was created using dictation software, which may contain spelling or grammatical errors.    Franklyn Sid SAILOR, MD 04/08/24 [phone]

## 2024-04-08 NOTE — ED Triage Notes (Signed)
 Pt to ED from home with c/o sob that started a couple of weeks ago after moving back in to old house. Pt says this happened before when living in this house and she went to [facility], they gave her meds that helped and pt moved out of house, now pt back in house and sob has started again. Pt has no pain

## 2024-04-08 NOTE — Discharge Instructions (Addendum)
 Thank you for coming to [facility] Emergency Department. You were seen for wheezing, shortness of breath. We did an exam, labs, and imaging, and these showed asthma exacerbation. Please take prednisone 40 mg for 5 days. We have prescribed albuterol inhaler to use every 4-6 hours as needed for wheezing/shortness of breath as well as Symbicort inhaler (steroid inhaler) to use twice per day (this has been shown to be superior to Advair).   Please follow up with your primary care provider within 1 week.   Do not hesitate to return to the ED or call 911 if you experience: -Worsening symptoms -Lightheadedness, passing out -Fevers/chills -Anything else that concerns you

## 2024-09-02 ENCOUNTER — Encounter (HOSPITAL_COMMUNITY): Payer: Self-pay

## 2024-09-02 ENCOUNTER — Emergency Department (HOSPITAL_COMMUNITY)
Admission: EM | Admit: 2024-09-02 | Discharge: 2024-09-02 | Disposition: A | Discharge status: Home / Self Care | Attending: Emergency Medicine | Admitting: Emergency Medicine

## 2024-09-02 ENCOUNTER — Other Ambulatory Visit: Payer: Self-pay

## 2024-09-02 ENCOUNTER — Telehealth (HOSPITAL_COMMUNITY): Payer: Self-pay | Admitting: Emergency Medicine

## 2024-09-02 DIAGNOSIS — R0602 Shortness of breath: Secondary | ICD-10-CM | POA: Insufficient documentation

## 2024-09-02 DIAGNOSIS — J45909 Unspecified asthma, uncomplicated: Secondary | ICD-10-CM | POA: Insufficient documentation

## 2024-09-02 DIAGNOSIS — R062 Wheezing: Secondary | ICD-10-CM

## 2024-09-02 DIAGNOSIS — Z7951 Long term (current) use of inhaled steroids: Secondary | ICD-10-CM | POA: Diagnosis not present

## 2024-09-02 DIAGNOSIS — J3489 Other specified disorders of nose and nasal sinuses: Secondary | ICD-10-CM | POA: Diagnosis not present

## 2024-09-02 DIAGNOSIS — Z77120 Contact with and (suspected) exposure to mold (toxic): Secondary | ICD-10-CM

## 2024-09-02 MED ORDER — PREDNISONE 10 MG PO TABS: 20.0000 mg | ORAL_TABLET | Freq: Two times a day (BID) | ORAL | 0 refills | Status: DC

## 2024-09-02 MED ORDER — IPRATROPIUM-ALBUTEROL 0.5-2.5 (3) MG/3ML IN SOLN
3.0000 mL | Freq: Once | RESPIRATORY_TRACT | Status: AC
  Administered 2024-09-02: 3 mL via RESPIRATORY_TRACT
  Filled 2024-09-02: qty 3

## 2024-09-02 MED ORDER — CETIRIZINE-PSEUDOEPHEDRINE ER 5-120 MG PO TB12: 1.0000 | ORAL_TABLET | Freq: Every day | ORAL | 0 refills | Status: DC

## 2024-09-02 MED ORDER — ALBUTEROL SULFATE HFA 108 (90 BASE) MCG/ACT IN AERS: 2.0000 | INHALATION_SPRAY | RESPIRATORY_TRACT | 0 refills | Status: DC | PRN

## 2024-09-02 MED ORDER — PREDNISONE 20 MG PO TABS
40.0000 mg | ORAL_TABLET | Freq: Once | ORAL | Status: AC
  Administered 2024-09-02: 40 mg via ORAL
  Filled 2024-09-02: qty 2

## 2024-09-02 MED ORDER — CETIRIZINE-PSEUDOEPHEDRINE ER 5-120 MG PO TB12: 1.0000 | ORAL_TABLET | Freq: Every day | ORAL | 0 refills | Status: AC

## 2024-09-02 NOTE — ED Provider Notes (Signed)
  EMERGENCY DEPARTMENT AT [facility] Provider Note   CSN: [id number] Arrival date & time: 09/02/24  9684     Patient presents with: Shortness of Breath   Tammy Howell is a 54 y.o. female.   Patient is a 53 year old female presenting with complaints of shortness of breath and wheezing.  Symptoms began in the night.  She has history of reactive airway and believes that mold in the house where she is stating is the cause.  She has been here several times in the past with similar issues.  No fevers or chills.  No productive cough.  She has used her albuterol  inhaler with minimal relief.       Prior to Admission medications   Medication Sig Start Date End Date Taking? Authorizing Provider  albuterol  (VENTOLIN  HFA) 108 (90 Base) MCG/ACT inhaler Inhale 1-2 puffs into the lungs every 6 (six) hours as needed for wheezing or shortness of breath. 04/08/24 05/08/24  Franklyn Sid SAILOR, MD  budesonide -formoterol  (SYMBICORT ) 80-4.5 MCG/ACT inhaler Inhale 2 puffs into the lungs 2 (two) times daily. 04/08/24   Franklyn Sid SAILOR, MD  estradiol  (ESTRACE ) 1 MG tablet TAKE 1 TABLET(1 MG) BY MOUTH DAILY 06/20/23   Signa Nest A, NP  progesterone  (PROMETRIUM ) 200 MG capsule TAKE ONE CAPSULE BY MOUTH EVERY NIGHT AT BEDTIME 06/20/23   Signa Nest LABOR, NP    Allergies: Patient has no known allergies.    Review of Systems  All other systems reviewed and are negative.   Updated Vital Signs BP (!) 188/157   Pulse (!) 117   Temp 98.3 F (36.8 C) (Oral)   Resp 19   Ht 5' 4 (1.626 m)   Wt 65.3 kg   LMP 07/31/2017   SpO2 96%   BMI 24.71 kg/m   Physical Exam Vitals and nursing note reviewed.  Constitutional:      General: She is not in acute distress.    Appearance: She is well-developed. She is not diaphoretic.  HENT:     Head: Normocephalic and atraumatic.  Cardiovascular:     Rate and Rhythm: Normal rate and regular rhythm.     Heart sounds: No murmur heard.    No  friction rub. No gallop.  Pulmonary:     Effort: Pulmonary effort is normal. No respiratory distress.     Breath sounds: Examination of the right-middle field reveals rhonchi. Examination of the left-middle field reveals rhonchi. Rhonchi present. No wheezing.  Abdominal:     General: Bowel sounds are normal. There is no distension.     Palpations: Abdomen is soft.     Tenderness: There is no abdominal tenderness.  Musculoskeletal:        General: Normal range of motion.     Cervical back: Normal range of motion and neck supple.  Skin:    General: Skin is warm and dry.  Neurological:     General: No focal deficit present.     Mental Status: She is alert and oriented to person, place, and time.     (all labs ordered are listed, but only abnormal results are displayed) Labs Reviewed - No data to display  EKG: None  Radiology: No results found.   Procedures   Medications Ordered in the ED  predniSONE  (DELTASONE ) tablet 40 mg (has no administration in time range)  ipratropium-albuterol  (DUONEB) 0.5-2.5 (3) MG/3ML nebulizer solution 3 mL (has no administration in time range)  Medical Decision Making Risk Prescription drug management.   Patient presenting with shortness of breath and wheezing.  She believes that she is being exposed to mold in her home environment.  Patient arrives here with stable vital signs and is afebrile.  She does have slight rhonchi bilaterally, but no respiratory distress.  Patient given a DuoNeb along with prednisone  and is feeling better.  She will be discharged with an albuterol  MDI and prednisone .  To return as needed.     Final diagnoses:  None    ED Discharge Orders     None          Geroldine Berg, MD 09/02/24 0430

## 2024-09-02 NOTE — ED Triage Notes (Signed)
 Pt to ED with c/o sob, possible asthma attack due to mold in house ongoing problem since May, 3rd house been in that has mold

## 2024-09-02 NOTE — ED Notes (Signed)
 Pt notes that she is feeling well again, requests discharge

## 2024-09-02 NOTE — Telephone Encounter (Signed)
 Patient never received 2 prescriptions that were sent as the pharmacy did not have none.  She wants prescription sent to different pharmacy

## 2024-09-02 NOTE — Discharge Instructions (Addendum)
 Use the albuterol  inhaler 2 puffs every 4 hours as needed.  Begin taking prednisone  and Zyrtec as prescribed.  Return to the ER if symptoms significantly worsen or change.

## 2024-09-24 ENCOUNTER — Emergency Department (HOSPITAL_COMMUNITY)

## 2024-09-24 ENCOUNTER — Encounter (HOSPITAL_COMMUNITY): Payer: Self-pay | Admitting: Emergency Medicine

## 2024-09-24 ENCOUNTER — Emergency Department (HOSPITAL_COMMUNITY)
Admission: EM | Admit: 2024-09-24 | Discharge: 2024-09-24 | Disposition: A | Discharge status: Home / Self Care | Attending: Emergency Medicine | Admitting: Emergency Medicine

## 2024-09-24 ENCOUNTER — Other Ambulatory Visit: Payer: Self-pay

## 2024-09-24 ENCOUNTER — Telehealth (HOSPITAL_COMMUNITY): Payer: Self-pay | Admitting: Emergency Medicine

## 2024-09-24 DIAGNOSIS — R0602 Shortness of breath: Secondary | ICD-10-CM

## 2024-09-24 DIAGNOSIS — J4541 Moderate persistent asthma with (acute) exacerbation: Secondary | ICD-10-CM

## 2024-09-24 LAB — CBC WITH DIFFERENTIAL/PLATELET
Abs Immature Granulocytes: 0.02 K/uL (ref 0.00–0.07)
Basophils Absolute: 0.1 K/uL (ref 0.0–0.1)
Basophils Relative: 1 %
Eosinophils Absolute: 0.9 K/uL — ABNORMAL HIGH (ref 0.0–0.5)
Eosinophils Relative: 11 %
HCT: 43 % (ref 36.0–46.0)
Hemoglobin: 14.1 g/dL (ref 12.0–15.0)
Immature Granulocytes: 0 %
Lymphocytes Relative: 48 %
Lymphs Abs: 3.8 K/uL (ref 0.7–4.0)
MCH: 30.4 pg (ref 26.0–34.0)
MCHC: 32.8 g/dL (ref 30.0–36.0)
MCV: 92.7 fL (ref 80.0–100.0)
Monocytes Absolute: 0.6 K/uL (ref 0.1–1.0)
Monocytes Relative: 8 %
Neutro Abs: 2.6 K/uL (ref 1.7–7.7)
Neutrophils Relative %: 32 %
Platelets: 345 K/uL (ref 150–400)
RBC: 4.64 MIL/uL (ref 3.87–5.11)
RDW: 13.2 % (ref 11.5–15.5)
WBC: 8 K/uL (ref 4.0–10.5)
nRBC: 0 % (ref 0.0–0.2)

## 2024-09-24 LAB — COMPREHENSIVE METABOLIC PANEL WITH GFR
ALT: 14 U/L (ref 0–44)
AST: 18 U/L (ref 15–41)
Albumin: 4.4 g/dL (ref 3.5–5.0)
Alkaline Phosphatase: 70 U/L (ref 38–126)
Anion gap: 9 (ref 5–15)
BUN: 6 mg/dL (ref 6–20)
CO2: 28 mmol/L (ref 22–32)
Calcium: 8.8 mg/dL — ABNORMAL LOW (ref 8.9–10.3)
Chloride: 107 mmol/L (ref 98–111)
Creatinine, Ser: 0.94 mg/dL (ref 0.44–1.00)
GFR, Estimated: >60 mL/min (ref >60)
Glucose, Bld: 81 mg/dL (ref 70–99)
Potassium: 4 mmol/L (ref 3.5–5.1)
Sodium: 144 mmol/L (ref 135–145)
Total Bilirubin: 0.3 mg/dL (ref 0.0–1.2)
Total Protein: 7.1 g/dL (ref 6.5–8.1)

## 2024-09-24 LAB — D-DIMER, QUANTITATIVE: D-Dimer, Quant: 0.36 ug{FEU}/mL (ref 0.00–0.50)

## 2024-09-24 MED ORDER — IPRATROPIUM BROMIDE 0.02 % IN SOLN
1.0000 mg | Freq: Once | RESPIRATORY_TRACT | Status: AC
  Administered 2024-09-24: 1 mg via RESPIRATORY_TRACT
  Filled 2024-09-24: qty 5

## 2024-09-24 MED ORDER — ALBUTEROL SULFATE (2.5 MG/3ML) 0.083% IN NEBU: 2.5000 mg | INHALATION_SOLUTION | Freq: Four times a day (QID) | RESPIRATORY_TRACT | 12 refills | Status: AC | PRN

## 2024-09-24 MED ORDER — METHYLPREDNISOLONE SODIUM SUCC 125 MG IJ SOLR
125.0000 mg | Freq: Once | INTRAMUSCULAR | Status: AC
  Administered 2024-09-24: 125 mg via INTRAVENOUS
  Filled 2024-09-24: qty 2

## 2024-09-24 MED ORDER — ALBUTEROL SULFATE (2.5 MG/3ML) 0.083% IN NEBU
5.0000 mg | INHALATION_SOLUTION | Freq: Once | RESPIRATORY_TRACT | Status: AC
  Administered 2024-09-24: 5 mg via RESPIRATORY_TRACT
  Filled 2024-09-24: qty 6

## 2024-09-24 MED ORDER — ALBUTEROL SULFATE HFA 108 (90 BASE) MCG/ACT IN AERS: 2.0000 | INHALATION_SPRAY | RESPIRATORY_TRACT | 0 refills | Status: AC | PRN

## 2024-09-24 MED ORDER — PREDNISONE 20 MG PO TABS: 60.0000 mg | ORAL_TABLET | Freq: Every day | ORAL | 0 refills | Status: AC

## 2024-09-24 NOTE — ED Triage Notes (Signed)
 Patient BIB [facility] c/o sob and wheezing for months, worse at night.  EMS reports 87% on room air.  Has had 1 albuterol  treatment and 1 duoneb treatment with EMS.    18 G L AC 120 HR 96% on duoneb

## 2024-09-24 NOTE — ED Notes (Signed)
 Patient placed on 2L [place] d/t oxygen saturation. MD made aware.

## 2024-09-24 NOTE — ED Notes (Signed)
 Ambulated with patient around nurse's station twice with pulse ox on finger. O2 stayed between 89-91% entire time, and only 91% for small amount of time. Pt stated she was feeling a little short of breath towards the end of the walk.

## 2024-09-24 NOTE — ED Provider Notes (Signed)
 [place] EMERGENCY DEPARTMENT AT [facility] Provider Note   CSN: [id number] Arrival date & time: 09/24/24  9478     Patient presents with: Shortness of Breath   Tammy Howell is a 53 y.o. female.  {Add pertinent medical, surgical, social history, OB history to YEP:67052} The patient presents with recurrent nocturnal shortness of breath and wheezing consistent with prior asthma exacerbations, worsened since moving back into an old house; symptoms improve when away from that environment. Episodes occur nightly on awakening; current episode began tonight. No chest pain reported. Denies leg swelling or leg pain. Aggravated by being in the house; previously improved with nebulized breathing treatments and prednisone  during a visit last year. Received breathing treatments from EMS prior to arrival today (details not provided). Smokes cigarettes but is attempting cessation. No recent travel or known sick contacts reported. Prior similar episodes noted with environmental exposure to the same residence. History obtained from the patient.   Shortness of Breath      Prior to Admission medications   Medication Sig Start Date End Date Taking? Authorizing Provider  albuterol  (VENTOLIN  HFA) 108 (90 Base) MCG/ACT inhaler Inhale 2 puffs into the lungs every 4 (four) hours as needed for wheezing or shortness of breath. 09/02/24   Geroldine Berg, MD  budesonide -formoterol  (SYMBICORT ) 80-4.5 MCG/ACT inhaler Inhale 2 puffs into the lungs 2 (two) times daily. 04/08/24   Franklyn Sid SAILOR, MD  cetirizine -pseudoephedrine  (ZYRTEC -D) 5-120 MG tablet Take 1 tablet by mouth daily. 09/02/24   Towana Ozell BROCKS, MD  estradiol  (ESTRACE ) 1 MG tablet TAKE 1 TABLET(1 MG) BY MOUTH DAILY 06/20/23   Signa Delon LABOR, NP  predniSONE  (DELTASONE ) 10 MG tablet Take 2 tablets (20 mg total) by mouth 2 (two) times daily. 09/02/24   Towana Ozell BROCKS, MD  progesterone  (PROMETRIUM ) 200 MG capsule TAKE ONE CAPSULE BY  MOUTH EVERY NIGHT AT BEDTIME 06/20/23   Signa Delon LABOR, NP    Allergies: Patient has no known allergies.    Review of Systems  Respiratory:  Positive for shortness of breath.     Updated Vital Signs BP 112/82   Pulse (!) 101   Temp 97.7 F (36.5 C) (Oral)   Resp 20   Wt 65.8 kg   LMP 07/31/2017   SpO2 92%   BMI 24.89 kg/m   Physical Exam Vitals and nursing note reviewed.  Constitutional:      Appearance: She is well-developed.  HENT:     Head: Normocephalic and atraumatic.  Cardiovascular:     Rate and Rhythm: Normal rate and regular rhythm.  Pulmonary:     Effort: No respiratory distress.     Breath sounds: No stridor. Decreased breath sounds and wheezing (R>L) present.  Abdominal:     General: There is no distension.  Musculoskeletal:     Cervical back: Normal range of motion.  Neurological:     Mental Status: She is alert.     (all labs ordered are listed, but only abnormal results are displayed) Labs Reviewed  D-DIMER, QUANTITATIVE  CBC WITH DIFFERENTIAL/PLATELET  COMPREHENSIVE METABOLIC PANEL WITH GFR    EKG: None  Radiology: No results found.  {Document cardiac monitor, telemetry assessment procedure when appropriate:32947} Procedures   Medications Ordered in the ED  albuterol  (PROVENTIL ) (2.5 MG/3ML) 0.083% nebulizer solution 5 mg (5 mg Nebulization Given 09/24/24 0608)  ipratropium (ATROVENT ) nebulizer solution 1 mg (1 mg Nebulization Given 09/24/24 0608)  methylPREDNISolone  sodium succinate (SOLU-MEDROL ) 125 mg/2 mL injection 125 mg (125 mg  Intravenous Given 09/24/24 0608)    Clinical Course as of 09/24/24 0644  Tue Sep 24, 2024  0612 Initial Evaluation:  Patient presents with recurrent nocturnal shortness of breath and wheezing associated with staying in an old house.  Differential Diagnosis: Acute asthma exacerbation (environmentally triggered) is most likely. Other considerations include COPD exacerbation, acute bronchitis, pneumonia,  allergic/irritant-induced bronchospasm (e.g., mold/dust), heart failure with nocturnal dyspnea, pulmonary embolism, anaphylaxis, vocal cord dysfunction, and GERD-related cough/bronchospasm. Life-threatening causes (PE, anaphylaxis, acute heart failure) considered.  Plan:  Administer inhaled bronchodilators (albuterol /ipratropium) and systemic corticosteroids in the ED Provide an albuterol  inhaler with spacer and a short steroid burst on discharge if symptoms improve Counsel on environmental avoidance of triggering residence and smoking cessation Arrange outpatient follow-up with primary care/pulmonology Provide return precautions for worsening symptoms Anticipate discharge after improvement; admit/observe if response is inadequate [JM]    Clinical Course User Index [JM] Macarthur Lorusso, Selinda, MD   {Click here for ABCD2, HEART and other calculators REFRESH Note before signing:1}                              Medical Decision Making Amount and/or Complexity of Data Reviewed Labs: ordered. Radiology: ordered.  Risk Prescription drug management.   ***  {Document critical care time when appropriate  Document review of labs and clinical decision tools ie CHADS2VASC2, etc  Document your independent review of radiology images and any outside records  Document your discussion with family members, caretakers and with consultants  Document social determinants of health affecting pt's care  Document your decision making why or why not admission, treatments were needed:32947:::1}   Final diagnoses:  None    ED Discharge Orders     None

## 2024-09-24 NOTE — ED Provider Notes (Signed)
 Signout from Dr. Lorette.  53 year old female with asthma here with shortness of breath.  No chest pain.  Has received breathing treatments and steroids.  New hypoxia.  Plan is to reassess and trend off of oxygen to see if hypoxia corrected.  D-dimer and chest x-ray unremarkable labs unremarkable. Physical Exam  BP 113/70   Pulse 97   Temp 97.7 F (36.5 C) (Oral)   Resp 13   Wt 65.8 kg   LMP 07/31/2017   SpO2 97%   BMI 24.89 kg/m   Physical Exam  Procedures  Procedures  ED Course / MDM   Clinical Course as of 09/24/24 0717  Tue Sep 24, 2024  0612 Initial Evaluation:  Patient presents with recurrent nocturnal shortness of breath and wheezing associated with staying in an old house.  Differential Diagnosis: Acute asthma exacerbation (environmentally triggered) is most likely. Other considerations include COPD exacerbation, acute bronchitis, pneumonia, allergic/irritant-induced bronchospasm (e.g., mold/dust), heart failure with nocturnal dyspnea, pulmonary embolism, anaphylaxis, vocal cord dysfunction, and GERD-related cough/bronchospasm. Life-threatening causes (PE, anaphylaxis, acute heart failure) considered.  Plan:  Administer inhaled bronchodilators (albuterol /ipratropium) and systemic corticosteroids in the ED Provide an albuterol  inhaler with spacer and a short steroid burst on discharge if symptoms improve Counsel on environmental avoidance of triggering residence and smoking cessation Arrange outpatient follow-up with primary care/pulmonology Provide return precautions for worsening symptoms Anticipate discharge after improvement; admit/observe if response is inadequate [JM]    Clinical Course User Index [JM] Mesner, Selinda, MD   Medical Decision Making Amount and/or Complexity of Data Reviewed Labs: ordered. Radiology: ordered.  Risk Prescription drug management.   8:30 AM.  Patient ambulated in the department and sats remained between 89 and 91%.  She said she  is feeling a little bit better.  She is very frustrated because she says when she goes back to her house her symptoms will recur.  She has been working on getting other housing but does not have the funds.  Has not seen a pulmonologist.  Will see if we can get her in with them.  She is also asking for a nebulizer.       Towana Ozell BROCKS, MD 09/24/24 [phone]

## 2024-09-24 NOTE — Telephone Encounter (Signed)
 To resend nebulizer and she also wanted albuterol  inhaler to [facility]

## 2024-09-24 NOTE — ED Notes (Signed)
 Paper prescription for nebulizer was sent to [facility] via fax, written by dr zammit.

## 2025-01-10 ENCOUNTER — Ambulatory Visit
